# Patient Record
Sex: Male | Born: 1945 | Race: White | Hispanic: No | Marital: Married | Smoking: Never smoker
Health system: Southern US, Community
[De-identification: ages and names within clinical notes are randomized; demographics above are authoritative.]

## PROBLEM LIST (undated history)

## (undated) DIAGNOSIS — I259 Chronic ischemic heart disease, unspecified: Secondary | ICD-10-CM

## (undated) DIAGNOSIS — I2510 Atherosclerotic heart disease of native coronary artery without angina pectoris: Secondary | ICD-10-CM

## (undated) DIAGNOSIS — I10 Essential (primary) hypertension: Secondary | ICD-10-CM

## (undated) DIAGNOSIS — I493 Ventricular premature depolarization: Secondary | ICD-10-CM

## (undated) DIAGNOSIS — Z9889 Other specified postprocedural states: Secondary | ICD-10-CM

## (undated) DIAGNOSIS — E785 Hyperlipidemia, unspecified: Secondary | ICD-10-CM

## (undated) DIAGNOSIS — K219 Gastro-esophageal reflux disease without esophagitis: Secondary | ICD-10-CM

## (undated) DIAGNOSIS — Z8489 Family history of other specified conditions: Secondary | ICD-10-CM

## (undated) DIAGNOSIS — I4891 Unspecified atrial fibrillation: Secondary | ICD-10-CM

## (undated) DIAGNOSIS — E119 Type 2 diabetes mellitus without complications: Secondary | ICD-10-CM

## (undated) DIAGNOSIS — R112 Nausea with vomiting, unspecified: Secondary | ICD-10-CM

## (undated) HISTORY — DX: Hyperlipidemia, unspecified: E78.5

## (undated) HISTORY — DX: Chronic ischemic heart disease, unspecified: I25.9

## (undated) HISTORY — DX: Unspecified atrial fibrillation: I48.91

## (undated) HISTORY — PX: CARDIAC CATHETERIZATION: SHX172

## (undated) HISTORY — PX: TONSILLECTOMY: SUR1361

## (undated) HISTORY — DX: Essential (primary) hypertension: I10

## (undated) HISTORY — PX: BACK SURGERY: SHX140

## (undated) HISTORY — DX: Ventricular premature depolarization: I49.3

---
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
## 1998-04-26 ENCOUNTER — Ambulatory Visit (HOSPITAL_COMMUNITY): Admission: RE | Admit: 1998-04-26 | Discharge: 1998-04-26 | Payer: Self-pay | Admitting: Specialist

## 1998-04-26 ENCOUNTER — Encounter: Payer: Self-pay | Admitting: Specialist

## 1998-04-27 ENCOUNTER — Ambulatory Visit (HOSPITAL_COMMUNITY): Admission: RE | Admit: 1998-04-27 | Discharge: 1998-04-27 | Payer: Self-pay | Admitting: Specialist

## 1998-04-27 ENCOUNTER — Encounter: Payer: Self-pay | Admitting: Specialist

## 1999-05-21 HISTORY — PX: POSTERIOR CERVICAL FUSION/FORAMINOTOMY: SHX5038

## 1999-07-02 ENCOUNTER — Encounter: Payer: Self-pay | Admitting: Neurosurgery

## 1999-07-02 ENCOUNTER — Encounter: Admission: RE | Admit: 1999-07-02 | Discharge: 1999-07-02 | Payer: Self-pay | Admitting: Family Medicine

## 2000-05-15 ENCOUNTER — Encounter: Payer: Self-pay | Admitting: Neurosurgery

## 2000-05-15 ENCOUNTER — Encounter: Admission: RE | Admit: 2000-05-15 | Discharge: 2000-05-15 | Payer: Self-pay | Admitting: Neurosurgery

## 2000-05-20 HISTORY — PX: ANTERIOR CERVICAL DECOMP/DISCECTOMY FUSION: SHX1161

## 2000-06-04 ENCOUNTER — Encounter: Payer: Self-pay | Admitting: Neurosurgery

## 2000-06-06 ENCOUNTER — Encounter: Payer: Self-pay | Admitting: Neurosurgery

## 2000-06-06 ENCOUNTER — Inpatient Hospital Stay (HOSPITAL_COMMUNITY): Admission: RE | Admit: 2000-06-06 | Discharge: 2000-06-07 | Payer: Self-pay | Admitting: Neurosurgery

## 2000-06-24 ENCOUNTER — Encounter: Admission: RE | Admit: 2000-06-24 | Discharge: 2000-06-24 | Payer: Self-pay | Admitting: Neurosurgery

## 2000-06-24 ENCOUNTER — Encounter: Payer: Self-pay | Admitting: Neurosurgery

## 2000-07-18 ENCOUNTER — Inpatient Hospital Stay (HOSPITAL_COMMUNITY): Admission: RE | Admit: 2000-07-18 | Discharge: 2000-07-25 | Payer: Self-pay | Admitting: Cardiology

## 2000-07-18 HISTORY — PX: CORONARY ARTERY BYPASS GRAFT: SHX141

## 2000-07-19 ENCOUNTER — Encounter: Payer: Self-pay | Admitting: Cardiothoracic Surgery

## 2000-07-21 ENCOUNTER — Encounter: Payer: Self-pay | Admitting: Cardiothoracic Surgery

## 2000-07-22 ENCOUNTER — Encounter: Payer: Self-pay | Admitting: Cardiothoracic Surgery

## 2000-07-23 ENCOUNTER — Encounter: Payer: Self-pay | Admitting: Cardiothoracic Surgery

## 2001-02-04 ENCOUNTER — Encounter: Admission: RE | Admit: 2001-02-04 | Discharge: 2001-02-04 | Payer: Self-pay | Admitting: *Deleted

## 2001-02-04 ENCOUNTER — Encounter (INDEPENDENT_AMBULATORY_CARE_PROVIDER_SITE_OTHER): Payer: Self-pay | Admitting: *Deleted

## 2001-02-04 ENCOUNTER — Encounter: Payer: Self-pay | Admitting: *Deleted

## 2001-02-26 ENCOUNTER — Encounter (INDEPENDENT_AMBULATORY_CARE_PROVIDER_SITE_OTHER): Payer: Self-pay | Admitting: *Deleted

## 2001-02-26 ENCOUNTER — Ambulatory Visit (HOSPITAL_COMMUNITY): Admission: RE | Admit: 2001-02-26 | Discharge: 2001-02-26 | Payer: Self-pay | Admitting: *Deleted

## 2001-08-05 ENCOUNTER — Encounter: Payer: Self-pay | Admitting: Cardiology

## 2001-08-05 ENCOUNTER — Inpatient Hospital Stay (HOSPITAL_COMMUNITY): Admission: EM | Admit: 2001-08-05 | Discharge: 2001-08-06 | Payer: Self-pay | Admitting: *Deleted

## 2002-03-11 ENCOUNTER — Encounter: Payer: Self-pay | Admitting: Neurosurgery

## 2002-03-11 ENCOUNTER — Encounter: Admission: RE | Admit: 2002-03-11 | Discharge: 2002-03-11 | Payer: Self-pay | Admitting: Neurosurgery

## 2006-09-09 ENCOUNTER — Inpatient Hospital Stay (HOSPITAL_BASED_OUTPATIENT_CLINIC_OR_DEPARTMENT_OTHER): Admission: RE | Admit: 2006-09-09 | Discharge: 2006-09-09 | Payer: Self-pay | Admitting: Cardiology

## 2008-04-18 ENCOUNTER — Encounter: Payer: Self-pay | Admitting: Gastroenterology

## 2008-05-02 ENCOUNTER — Encounter (INDEPENDENT_AMBULATORY_CARE_PROVIDER_SITE_OTHER): Payer: Self-pay | Admitting: *Deleted

## 2008-05-02 ENCOUNTER — Encounter: Admission: RE | Admit: 2008-05-02 | Discharge: 2008-05-02 | Payer: Self-pay | Admitting: Internal Medicine

## 2008-05-09 DIAGNOSIS — K219 Gastro-esophageal reflux disease without esophagitis: Secondary | ICD-10-CM | POA: Insufficient documentation

## 2008-05-09 DIAGNOSIS — K449 Diaphragmatic hernia without obstruction or gangrene: Secondary | ICD-10-CM | POA: Insufficient documentation

## 2008-05-09 DIAGNOSIS — K8020 Calculus of gallbladder without cholecystitis without obstruction: Secondary | ICD-10-CM | POA: Insufficient documentation

## 2008-05-10 ENCOUNTER — Ambulatory Visit: Payer: Self-pay | Admitting: Gastroenterology

## 2008-05-10 DIAGNOSIS — R7309 Other abnormal glucose: Secondary | ICD-10-CM | POA: Insufficient documentation

## 2008-05-10 DIAGNOSIS — R197 Diarrhea, unspecified: Secondary | ICD-10-CM | POA: Insufficient documentation

## 2008-05-10 DIAGNOSIS — F411 Generalized anxiety disorder: Secondary | ICD-10-CM | POA: Insufficient documentation

## 2008-05-10 DIAGNOSIS — E785 Hyperlipidemia, unspecified: Secondary | ICD-10-CM | POA: Insufficient documentation

## 2008-05-10 DIAGNOSIS — Z8679 Personal history of other diseases of the circulatory system: Secondary | ICD-10-CM | POA: Insufficient documentation

## 2008-05-10 DIAGNOSIS — I2510 Atherosclerotic heart disease of native coronary artery without angina pectoris: Secondary | ICD-10-CM | POA: Insufficient documentation

## 2008-05-10 DIAGNOSIS — R1032 Left lower quadrant pain: Secondary | ICD-10-CM | POA: Insufficient documentation

## 2008-05-10 DIAGNOSIS — I10 Essential (primary) hypertension: Secondary | ICD-10-CM | POA: Insufficient documentation

## 2008-05-10 DIAGNOSIS — M129 Arthropathy, unspecified: Secondary | ICD-10-CM | POA: Insufficient documentation

## 2008-05-10 DIAGNOSIS — K5730 Diverticulosis of large intestine without perforation or abscess without bleeding: Secondary | ICD-10-CM | POA: Insufficient documentation

## 2008-06-13 ENCOUNTER — Ambulatory Visit: Payer: Self-pay | Admitting: Gastroenterology

## 2008-12-19 ENCOUNTER — Encounter: Admission: RE | Admit: 2008-12-19 | Discharge: 2008-12-19 | Payer: Self-pay | Admitting: Chiropractic Medicine

## 2009-01-14 ENCOUNTER — Inpatient Hospital Stay (HOSPITAL_COMMUNITY): Admission: EM | Admit: 2009-01-14 | Discharge: 2009-01-15 | Payer: Self-pay | Admitting: Emergency Medicine

## 2009-01-18 HISTORY — PX: LAPAROSCOPIC CHOLECYSTECTOMY: SUR755

## 2009-02-14 ENCOUNTER — Ambulatory Visit (HOSPITAL_COMMUNITY): Admission: RE | Admit: 2009-02-14 | Discharge: 2009-02-14 | Payer: Self-pay | Admitting: General Surgery

## 2009-02-14 ENCOUNTER — Encounter (INDEPENDENT_AMBULATORY_CARE_PROVIDER_SITE_OTHER): Payer: Self-pay | Admitting: General Surgery

## 2009-09-05 ENCOUNTER — Encounter: Payer: Self-pay | Admitting: Gastroenterology

## 2009-12-02 IMAGING — CT CT ANGIO CHEST
2 of 6 series · 17 of 46 positions shown · IV contrast (APPLIED)
Comparison: Ultrasound abdomen 05/02/2008

CTA CHEST

CLINICAL DATA: Chest pain.  Midsternal chest pain radiating to
back.  History of prior CABG.

CT ANGIOGRAPHY CHEST AND ABDOMEN
TECHNIQUE: Multidetector CT imaging of the chest and abdomen was
performed without contrast.  Next, using the standard protocol
during bolus administration of intravenous contrast. Multiplanar CT
image reconstructions including MIPs were obtained to evaluate the
vascular anatomy.
Contrast: 100 ml Omnipaque 350

[Series 5: dissection 2.0 st · axial · 0.81mm/px · z∈[-458,-68]mm · 14 of 226 slices shown]
[im 16/226  lung]
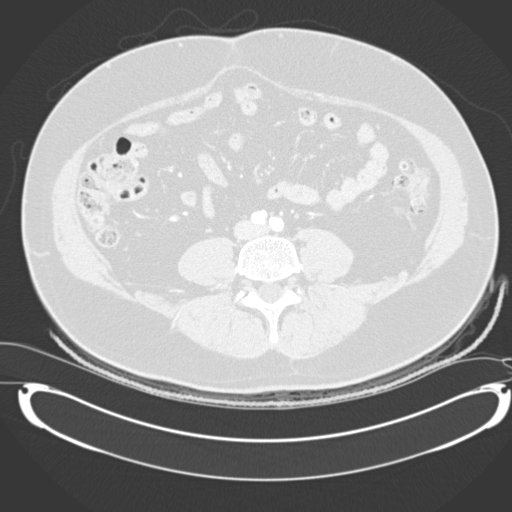
[im 31/226  soft-tissue]
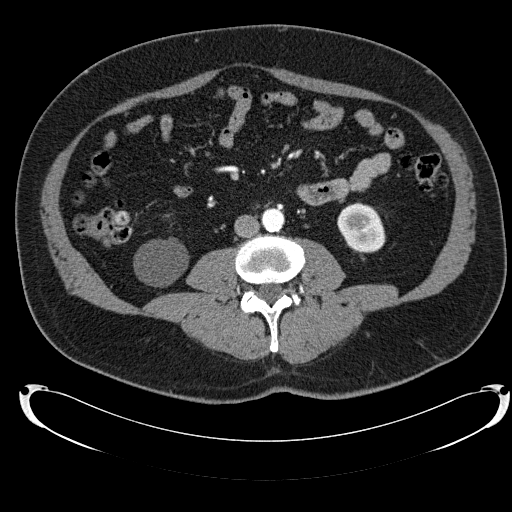
[im 46/226  lung]
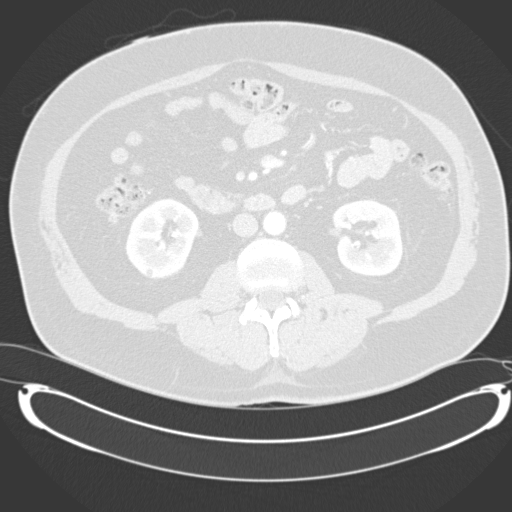
[im 61/226  soft-tissue]
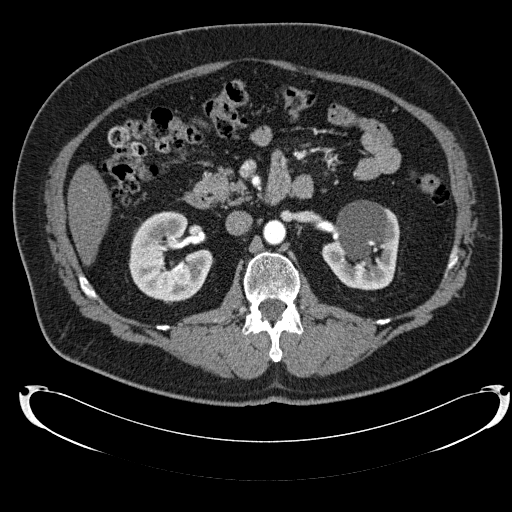
[im 76/226  lung]
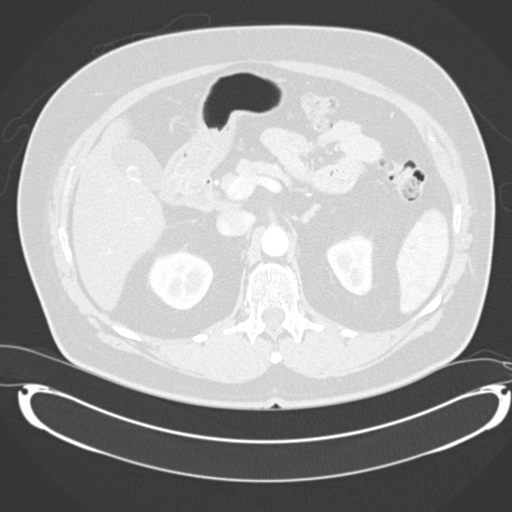
[im 91/226  soft-tissue]
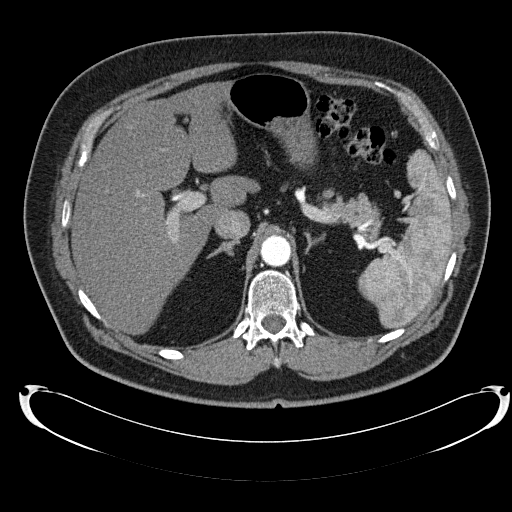
[im 106/226  lung]
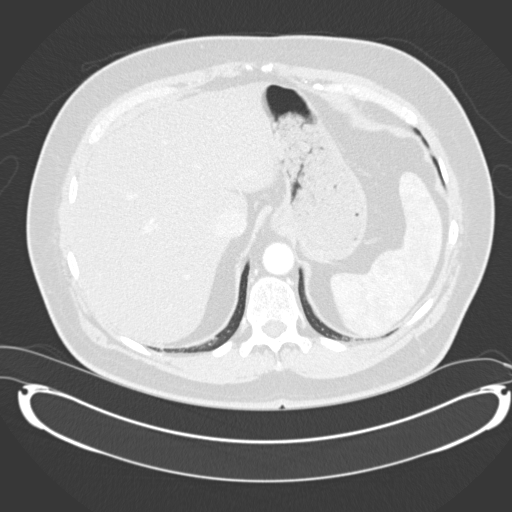
[im 121/226  soft-tissue]
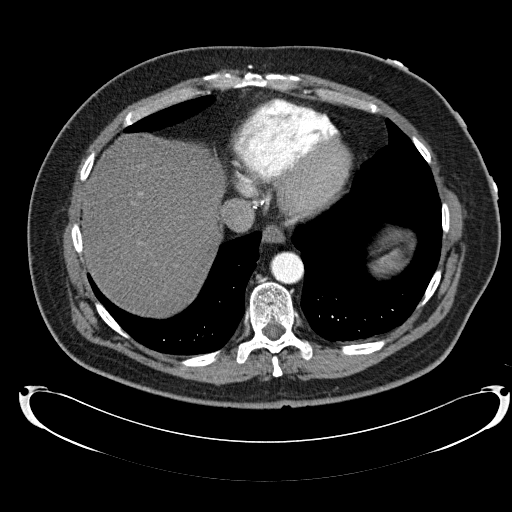
[im 136/226  lung]
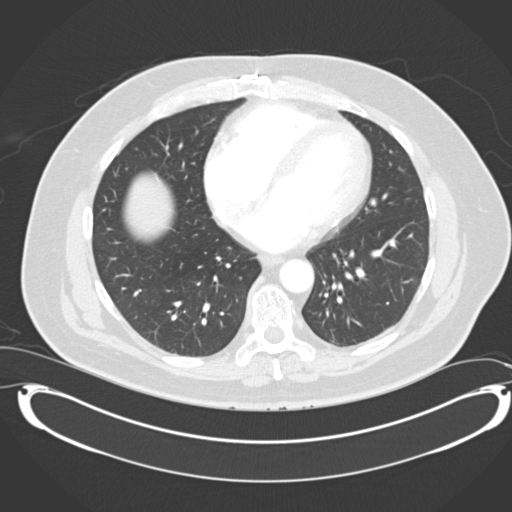
[im 151/226  soft-tissue]
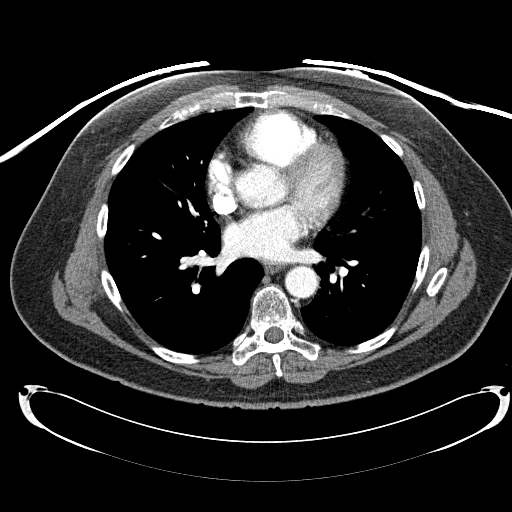
[im 166/226  lung]
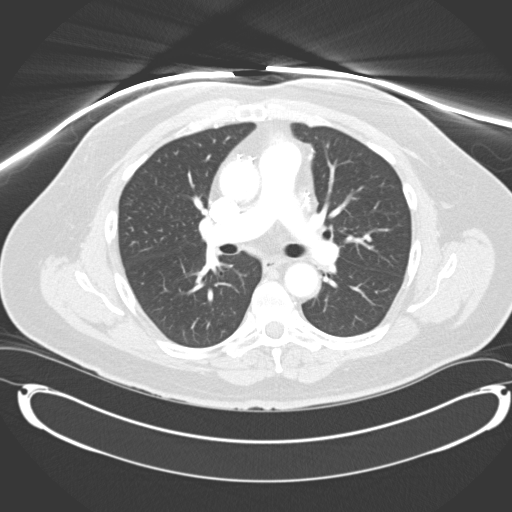
[im 181/226  soft-tissue]
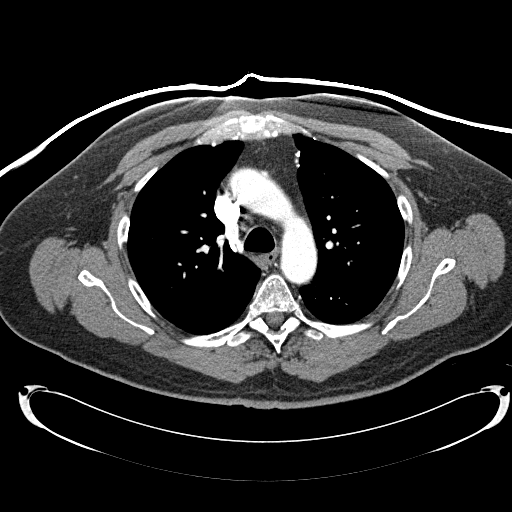
[im 196/226  lung]
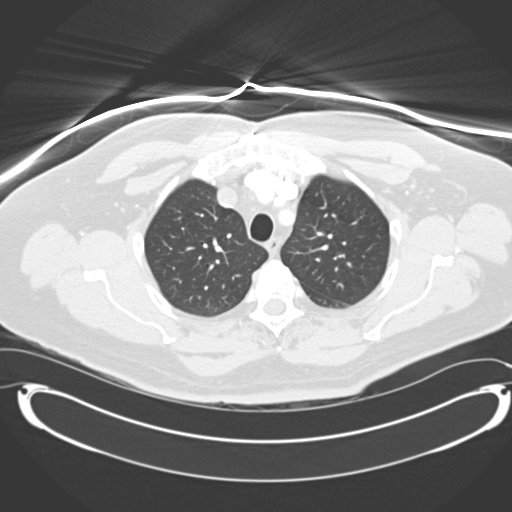
[im 211/226  soft-tissue]
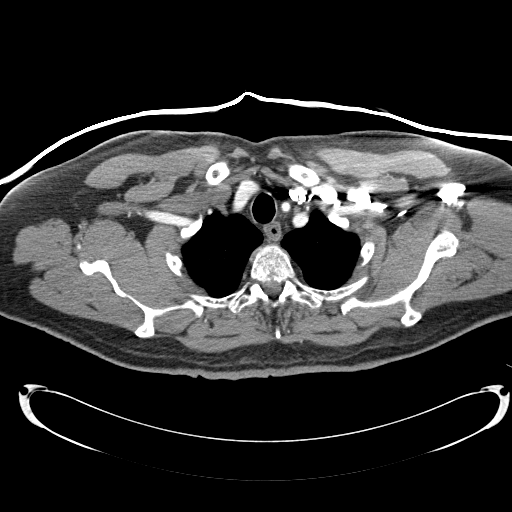

[Series 7: dissection 2.0 lung · axial · 0.81mm/px · z∈[-284,-222]mm · 3 of 139 slices shown]
[im 16/139  soft-tissue]
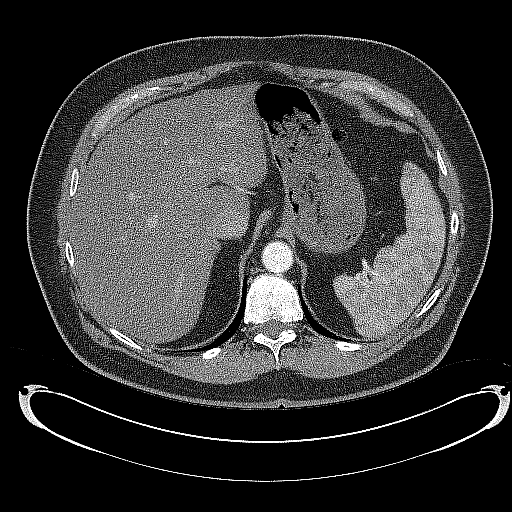
[im 31/139  soft-tissue]
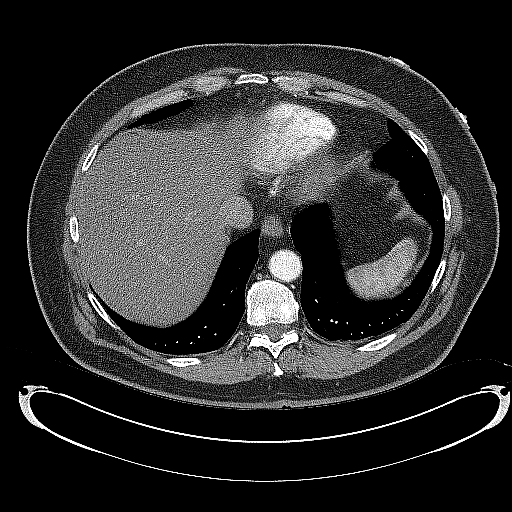
[im 47/139  soft-tissue]
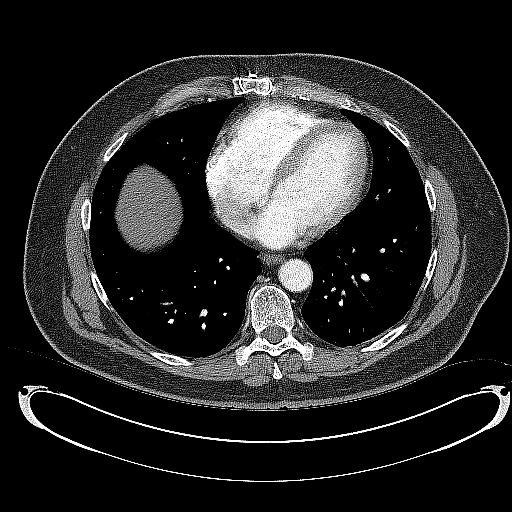

[17 of 46 positions shown; findings below may reference images not displayed]

FINDINGS: Noncontrast images of the chest show no evidence of
intramural hematoma.  There are scattered foci of calcification
within the thoracic aorta.  The thoracic aorta is normal in
caliber.

Postcontrast images of the chest shows satisfactory opacification
of the thoracic aorta.

Negative for aortic dissection.

Negative for mediastinal hematoma.

Negative for pleural or pericardial effusion or lymphadenopathy.

There are changes of median sternotomy for prior CABG.  Coronary
artery calcifications are noted.  Heart size is upper normal.

The thoracic inlet and thyroid gland are unremarkable.

There is a very small hiatal hernia.

The trachea and mainstem bronchi are patent. Aside from minimal
dependent atelectasis, the lungs are clear.

No acute or healing rib fracture is identified.

The thoracic spine vertebral bodies are normal in height and
alignment.

 Review of the MIP images confirms the above findings.
IMPRESSION: 1.  Negative for intramural hematoma, dissection, or aneurysm of
the thoracic aorta.
2.  Prior CABG.  Coronary artery and scattered thoracic aorta
atherosclerotic calcification.
3.  No acute findings in the lungs.
4.  Very small hiatal hernia.

CTA ABDOMEN
FINDINGS: Noncontrast images of the abdomen show no evidence of
acute intramural hematoma within the abdominal aorta.  The
abdominal aorta is normal in caliber.  No ectasia or aneurysm is
identified.

There are several peripherally calcified gallstones within the
gallbladder.  Largest gallstone measures approximately 11 mm.

Negative for renal calculi.  There is a 5.3 x 3.8 cm cyst in the
mid pole of the left kidney and a 1.0 cm cyst mid pole of the left
kidney.  Extending exophytically from the lower pole of the right
kidney is a 4.3 x 3.8 cm cyst.

On postcontrast images, none of the aforementioned renal cysts
enhance or show nodularity, compatible with bilateral simple renal
cysts.  Additionally visualized is a 3 mm cortically based low
density lesion posteriorly in the right kidney and a 3 mm
cortically based low density lesion in the anterior right kidney,
too small to characterize but likely cysts.

The abdominal aorta enhances normally.  Negative for dissection.
The bilateral renal arteries, celiac axis, superior mesenteric
artery, and inferior mesenteric artery are widely patent and
enhance normally. No stenoses are identified in the branches of the
aorta.

No focal liver lesion is identified on arterial phase imaging.
Normal arterial phase enhancement of the spleen is identified.

The adrenal glands and pancreas are within normal limits.

Bowel loops are normal in caliber and wall thickness.

The mesenteries are unremarkable.

No ascites or lymphadenopathy is identified.

The oral origin of the common iliac arteries bilaterally
demonstrate scattered atherosclerotic calcification without
evidence of aneurysm or dissection.

The visualized thoracolumbar spine vertebral bodies are normal in
height and alignment.

 Review of the MIP images confirms the above findings.
IMPRESSION: 1.  Negative for abdominal aortic aneurysm, ectasia, intramural
hematoma, or dissection.
2.  Scattered atherosclerotic calcification of the abdominal aorta
and visualized common iliac arteries bilaterally.
3.  Cholelithiasis.
4.  Bilateral renal cysts.

## 2010-01-02 IMAGING — RF DG CHOLANGIOGRAM OPERATIVE
1 series · 4 of 4 positions shown · non-contrast
Comparison: Abdominal CT 01/14/2009.

CLINICAL DATA: Gallstones.  Laparoscopic cholecystectomy.

INTRAOPERATIVE CHOLANGIOGRAM
TECHNIQUE: Cholangiographic images from the C-arm fluoroscopic
device were submitted for interpretation post-operatively.  Please
see the procedural report for the amount of contrast and the
fluoroscopy time utilized.

[Series 1: run · 4 of 65 frames shown]
[frame 2/65]
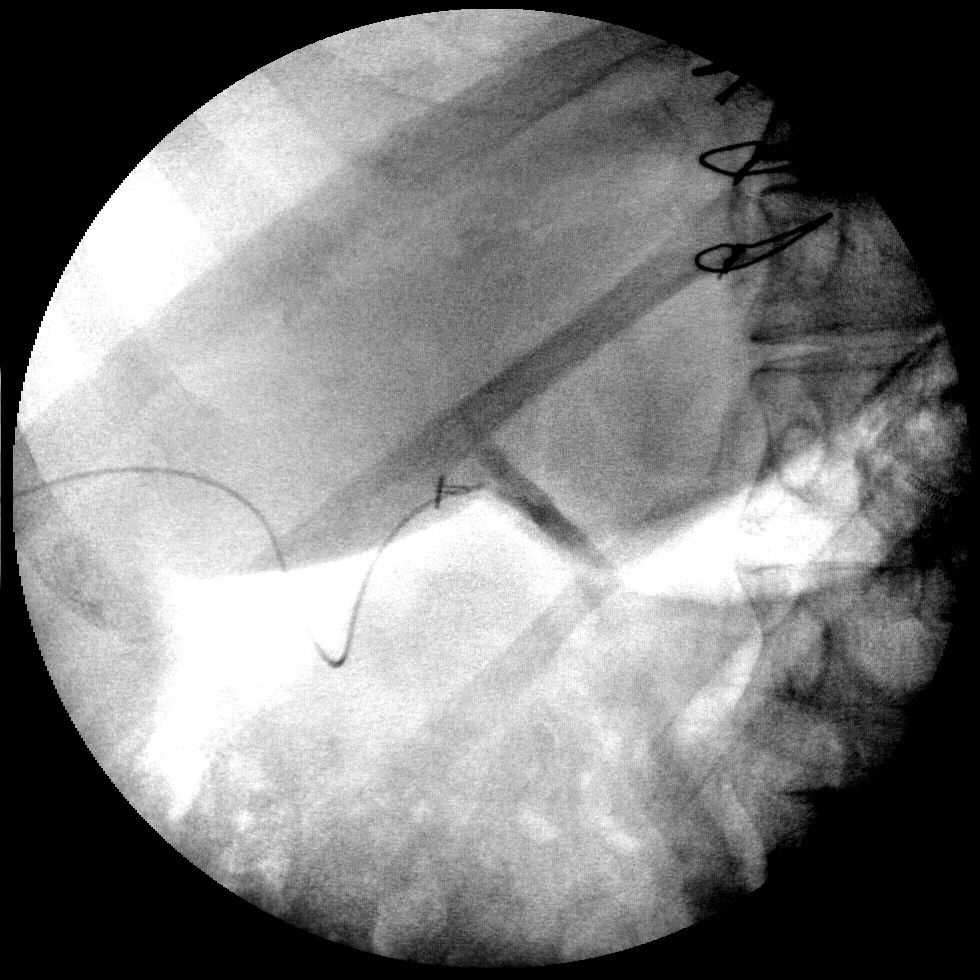
[frame 10/65]
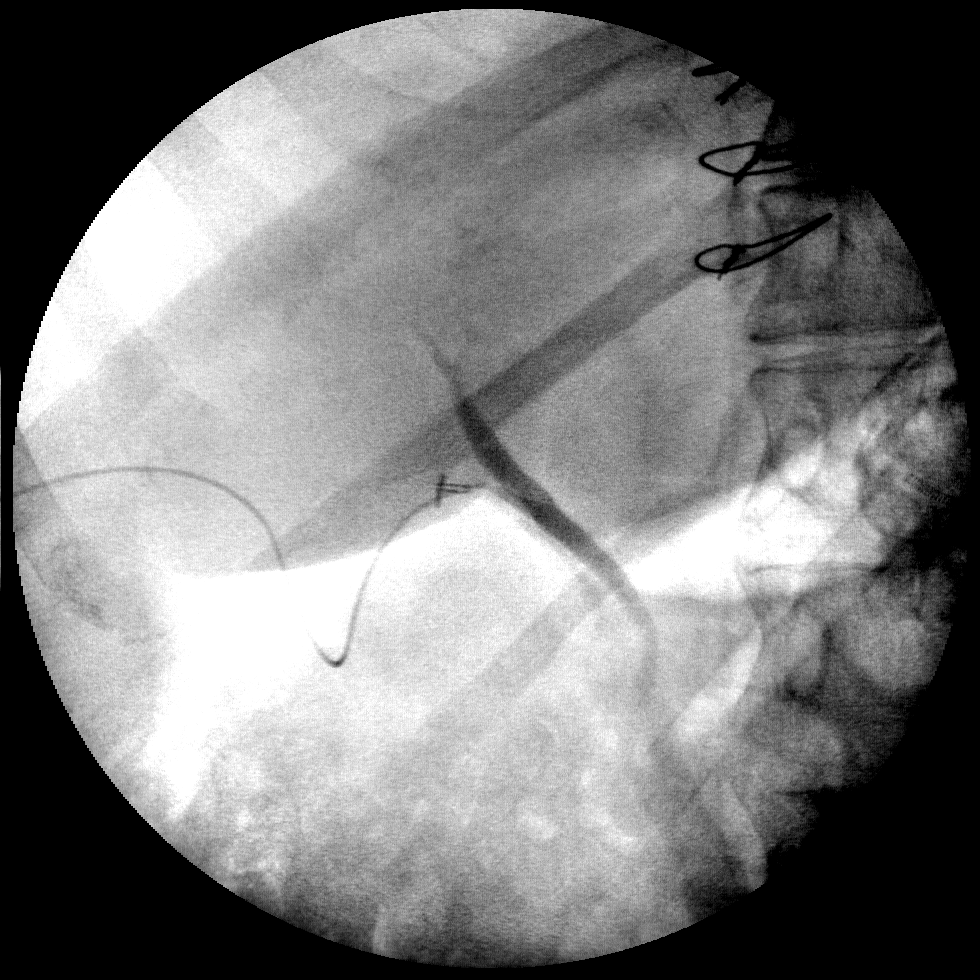
[frame 33/65]
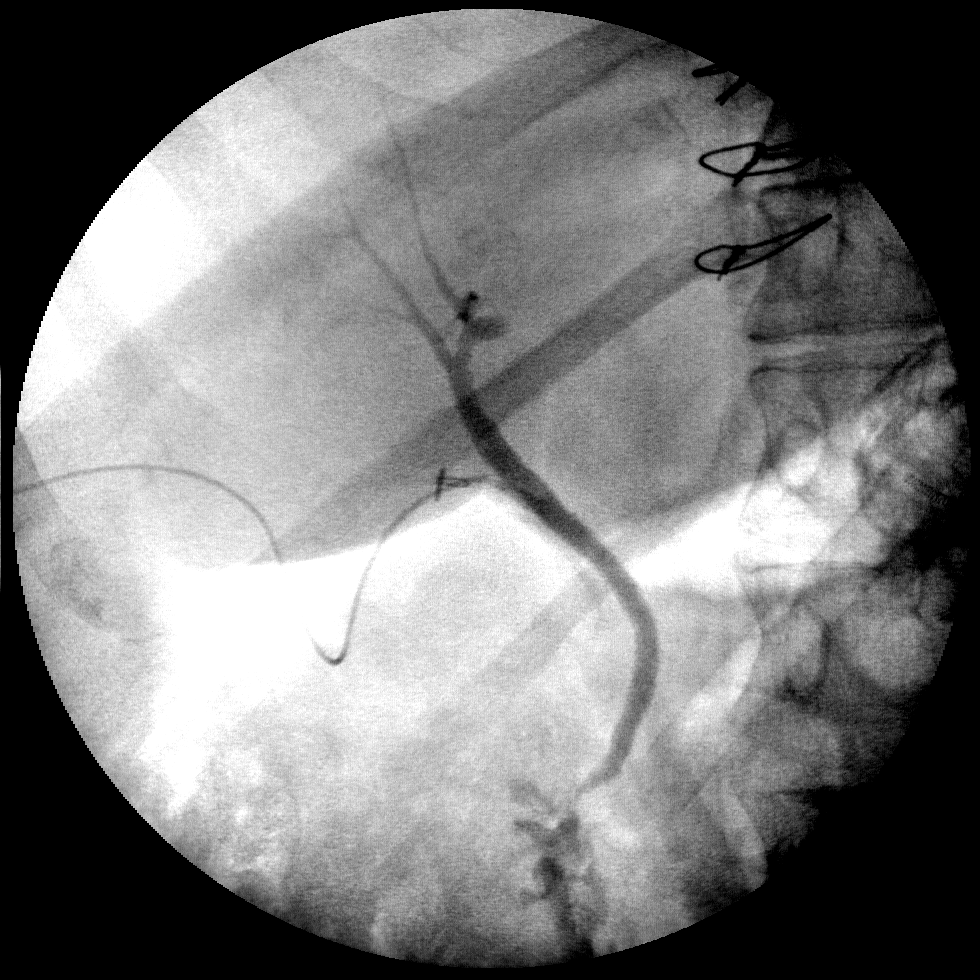
[frame 56/65]
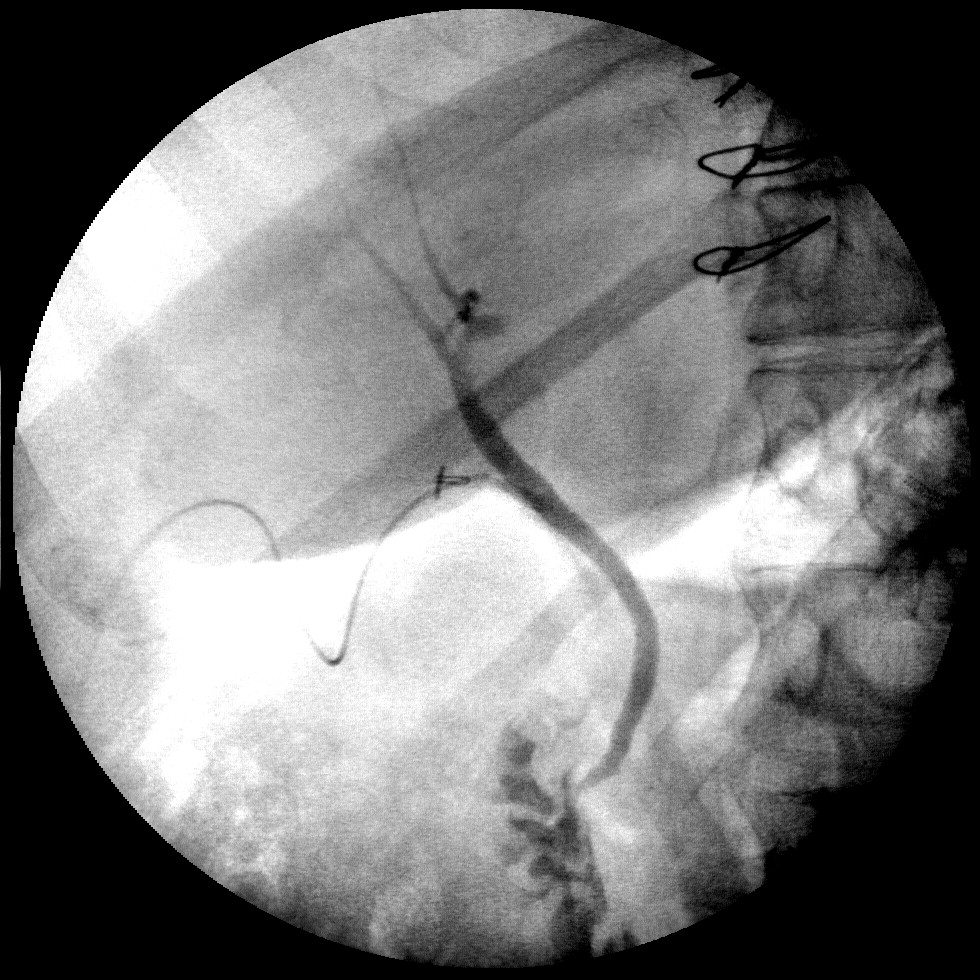

[4 of 4 positions shown; findings below may reference images not displayed]

FINDINGS: Injection of the cystic duct remnant demonstrates no
biliary dilatation or definite retained calculi.  There is no
ductal obstruction or extravasation.  The visualized intrahepatic
biliary system appears unremarkable.
IMPRESSION: Negative for retained calculus or ductal obstruction.

## 2010-04-16 ENCOUNTER — Ambulatory Visit: Payer: Self-pay | Admitting: Cardiology

## 2010-06-11 ENCOUNTER — Encounter: Payer: Self-pay | Admitting: Neurosurgery

## 2010-06-21 NOTE — Letter (Signed)
Summary: [facility] 2011 Medical Record Request  [facility] 2011 Medical Record Request   Imported By: Kassie Mends 09/06/2009 10:07:24  _____________________________________________________________________  External Attachment:    Type:   Image     Comment:   External Document

## 2010-06-22 ENCOUNTER — Ambulatory Visit (INDEPENDENT_AMBULATORY_CARE_PROVIDER_SITE_OTHER): Payer: Self-pay | Admitting: *Deleted

## 2010-06-22 ENCOUNTER — Encounter: Payer: Self-pay | Admitting: Cardiology

## 2010-06-22 DIAGNOSIS — R002 Palpitations: Secondary | ICD-10-CM

## 2010-06-22 DIAGNOSIS — I2510 Atherosclerotic heart disease of native coronary artery without angina pectoris: Secondary | ICD-10-CM

## 2010-08-05 ENCOUNTER — Emergency Department (HOSPITAL_COMMUNITY): Payer: Non-veteran care

## 2010-08-05 ENCOUNTER — Observation Stay (HOSPITAL_COMMUNITY)
Admission: EM | Admit: 2010-08-05 | Discharge: 2010-08-05 | Disposition: A | Discharge status: Home / Self Care | Payer: Non-veteran care | Attending: Internal Medicine | Admitting: Internal Medicine

## 2010-08-05 DIAGNOSIS — R002 Palpitations: Secondary | ICD-10-CM

## 2010-08-05 DIAGNOSIS — E785 Hyperlipidemia, unspecified: Secondary | ICD-10-CM | POA: Insufficient documentation

## 2010-08-05 DIAGNOSIS — R0602 Shortness of breath: Secondary | ICD-10-CM | POA: Insufficient documentation

## 2010-08-05 DIAGNOSIS — E119 Type 2 diabetes mellitus without complications: Secondary | ICD-10-CM | POA: Insufficient documentation

## 2010-08-05 DIAGNOSIS — I10 Essential (primary) hypertension: Secondary | ICD-10-CM | POA: Insufficient documentation

## 2010-08-05 DIAGNOSIS — I2510 Atherosclerotic heart disease of native coronary artery without angina pectoris: Secondary | ICD-10-CM | POA: Insufficient documentation

## 2010-08-05 DIAGNOSIS — R079 Chest pain, unspecified: Secondary | ICD-10-CM | POA: Insufficient documentation

## 2010-08-05 DIAGNOSIS — K219 Gastro-esophageal reflux disease without esophagitis: Secondary | ICD-10-CM | POA: Insufficient documentation

## 2010-08-05 LAB — LIPID PANEL
Cholesterol: 159 mg/dL (ref 0–200)
LDL Cholesterol: 112 mg/dL — ABNORMAL HIGH (ref 0–99)
Total CHOL/HDL Ratio: 5.3 RATIO

## 2010-08-05 LAB — APTT: aPTT: 30 seconds (ref 24–37)

## 2010-08-05 LAB — COMPREHENSIVE METABOLIC PANEL
AST: 31 U/L (ref 0–37)
Albumin: 4.1 g/dL (ref 3.5–5.2)
Chloride: 104 mEq/L (ref 96–112)
Creatinine, Ser: 0.88 mg/dL (ref 0.4–1.5)
GFR calc Af Amer: >60 mL/min (ref >60)
Total Bilirubin: 0.6 mg/dL (ref 0.3–1.2)
Total Protein: 7.3 g/dL (ref 6.0–8.3)

## 2010-08-05 LAB — POCT CARDIAC MARKERS: Troponin i, poc: <0.05 ng/mL (ref 0.00–0.09)

## 2010-08-05 LAB — CBC
Hemoglobin: 14.8 g/dL (ref 13.0–17.0)
MCH: 28.5 pg (ref 26.0–34.0)
MCV: 84.4 fL (ref 78.0–100.0)
RBC: 5.19 MIL/uL (ref 4.22–5.81)

## 2010-08-05 LAB — DIFFERENTIAL
Basophils Relative: 1 % (ref 0–1)
Lymphs Abs: 0.8 10*3/uL (ref 0.7–4.0)
Monocytes Relative: 8 % (ref 3–12)
Neutro Abs: 4.8 10*3/uL (ref 1.7–7.7)
Neutrophils Relative %: 76 % (ref 43–77)

## 2010-08-05 LAB — GLUCOSE, CAPILLARY: Glucose-Capillary: 192 mg/dL — ABNORMAL HIGH (ref 70–99)

## 2010-08-05 LAB — BRAIN NATRIURETIC PEPTIDE: Pro B Natriuretic peptide (BNP): 62 pg/mL (ref 0.0–100.0)

## 2010-08-05 LAB — CARDIAC PANEL(CRET KIN+CKTOT+MB+TROPI)
CK, MB: 1.3 ng/mL (ref 0.3–4.0)
Relative Index: 0.9 (ref 0.0–2.5)

## 2010-08-05 LAB — PROTIME-INR
INR: 0.95 (ref 0.00–1.49)
Prothrombin Time: 12.9 seconds (ref 11.6–15.2)

## 2010-08-10 NOTE — H&P (Signed)
NAME:  Shane Peterson, Shane Peterson NO.:  [id number]  MEDICAL RECORD NO.:  [id number]           PATIENT TYPE:  E  LOCATION:  [facility]                         FACILITY:  [facility]  PHYSICIAN:  Ramiro Harvest, MD    DATE OF BIRTH:  Mar 23, 1946  DATE OF ADMISSION:  08/05/2010 DATE OF DISCHARGE:                             HISTORY & PHYSICAL   PRIMARY CARE PHYSICIAN:  Gwen Pounds, MD.  CARDIOLOGIST:  Colleen Can. Deborah Chalk, MD  CHIEF COMPLAINTS:  Palpitations.  HISTORY OF PRESENT ILLNESS:  Shane Peterson is a pleasant 65 year old Caucasian gentleman with a history of coronary artery disease, status post CABG in 2002, history of hypertension, type 2 diabetes, hyperlipidemia, gastroesophageal reflux disease, history of palpitations, status post aortofemoral bypass presented to the ED with palpitations.  The patient stated that 1 day prior to admission while sitting in his truck, he felt his heart skipping beats and he also had some early beats lasting approximately 2 hours.  The patient stated that he had a second episode while sleeping at 11 p.m. the night prior to admission with some associated chest heaviness/tightness which was nonradiating with some associated shortness of breath, generalized weakness and near syncope.  The patient denied any nausea. No vomiting. No diaphoresis.  No dysuria.  No fever.  No chills.  No abdominal pain. No diarrhea.  No constipation.  No focal neurological symptoms.  No cough.  No other associated symptoms.  The patient stated that he saw his cardiologist in January of this year for palpitations, wore a Holter monitor for approximately a day, which was unremarkable.  The patient subsequently presented to the ED.  In the ED, EKG which was obtained was normal sinus rhythm.  D-dimer was negative.  Magnesium level was 2.1. CMET was within normal limits.  CBC was within normal limits.  Point of care cardiac markers were negative.  We are called to admit the  patient for further evaluation and management.  The patient is currently chest painfree.  ALLERGIES:  CODEINE causes hallucinations.  SULFA causes angioedema.  PAST MEDICAL HISTORY: 1. History of coronary artery disease, status post CABG in 2002. 2. Type 2 diabetes. 3. History of dyslipidemia. 4. History of cervical disk disease, status post cervical laminectomy. 5. Hypertension. 6. Status post appendectomy. 7. Status post aortofemoral bypass. 8. Status post tonsillectomy. 9. History of chronic palpitations with a prior SVT in the past. 10.Status post laparoscopic cholecystectomy, February 14, 2009 per     Dr. Zachery Dakins. 11.Gastroesophageal reflux disease. 12.Severe diverticulosis per colonoscopy of January 2010. 13.Arthritis. 14.Anxiety. 15.Hiatal hernia per EGD. 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  HOME MEDICATIONS: 1. Glipizide 2.5 mg at breakfast and 2.5 mg at lunch. 2. Fish oil 1000 mg 3 tablets in the morning and 2 tablets in the     evening. 3. Loratadine 10 mg p.o. daily p.r.n. 4. Pravachol 10 mg p.o. daily. 5. Norvasc 10 mg p.o. daily. 6. Lisinopril 5 mg p.o. daily. 7. Atenolol 25 mg p.o. b.i.d. 8. Aspirin 325 mg p.o. daily.  FAMILY  HISTORY:  Father deceased age 6 from an MI.  Mother deceased at age 32 secondary to her stroke.  Mother also had a history of AFib and hypertension.  SOCIAL HISTORY:  No tobacco use.  No alcohol use.  No IV drug use.  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  REVIEW OF SYSTEMS:  As per HPI, otherwise negative.  PHYSICAL EXAMINATION:  VITAL SIGNS:  Temperature 97.7, blood pressure 126/67, pulse of 64, respirations 12, satting 96% on room air. GENERAL:  The patient is well-developed, well-nourished gentleman, no acute cardiopulmonary distress. HEENT:  Normocephalic, atraumatic.  Pupils  equal, round and reactive to light and accommodation.  Extraocular movements intact.  Oropharynx is clear.  No lesions.  No exudates. NECK:  Supple.  No lymphadenopathy. RESPIRATORY:  Lungs are clear to auscultation bilaterally.  No wheezes. No crackles.  No rhonchi. CARDIOVASCULAR:  Regular rate and rhythm.  No murmurs, rubs or gallops. ABDOMEN:  Soft, nontender, nondistended.  Positive bowel sounds. EXTREMITIES:  No clubbing, cyanosis or edema. NEUROLOGIC:  The patient is alert and oriented x3.  Cranial nerves II- XII are grossly intact with no focal deficits.  ADMISSION LABS:  D-dimer less than 0.22, magnesium 2.1.  CMET, sodium 140, potassium 3.5, chloride 104, bicarb 28, glucose 149, BUN 16, creatinine 0.88, bilirubin of 0.6, alk phosphatase 99, AST 31, ALT 49, protein of 7.3, albumin of 4.1, calcium of 9.6.  CBC with a white count of 6.3, hemoglobin 14.8, hematocrit 43.8, platelet count of 175, ANC of 4.8.  Point of care cardiac markers CK-MB 1.6, troponin-I less than 0.05, myoglobin of 99.5.  EKG with a normal sinus rhythm.  Chest x-ray shows no evidence of active pulmonary disease.  ASSESSMENT AND PLAN:  Mr. Shane Peterson is a pleasant 65 year old gentleman with a history of coronary artery disease status post CABG in 2002, history of type 2 diabetes, hyperlipidemia, hypertension, status post aortofemoral bypass presenting to the ED with palpitations. 1. Palpitations.  Questionable etiology.  We will admit the patient to     telemetry.  We will cycle cardiac enzymes q.8 h x3.  Check a TSH.     Check coags.  Check a UA.  Check a fasting lipid panel.  CMET is     unremarkable.  Magnesium level is within normal limits.  We will     resume home dose atenolol.  We will also check a 2-D echo.  We will     consult Cardiology for further evaluation and recommendations. 2. Chest pain likely secondary to problem #1 versus acute coronary     syndrome.  The patient with a history of  coronary artery disease,     status post CABG in 2002, history of hypertension, hyperlipidemia,     type 2 diabetes.  We will cycle cardiac enzymes q.8 h x3.  D-dimer     was negative.  Magnesium level was within normal limits.  EKG was     normal sinus rhythm.  We will check a TSH.  Check coags.  Check a 2-     D echo.  We will place on oxygen, nitroglycerin as needed, morphine     sulfate as needed.  Continue home dose beta-blocker, aspirin,     Norvasc, ACE inhibitor and statin and fish oil and consult with     Cardiology for further evaluation and recommendations. 3. Hypertension, stable.  Continue home dose atenolol, lisinopril and  Norvasc. 4. Type 2 diabetes.  Check a hemoglobin A1c.  We will hold the     patient's oral hypoglycemics while in-house and we will place on     sliding-scale insulin. 5. Hyperlipidemia.  Resume home dose pravastatin and fish oil. 6. History of coronary artery disease, status post CABG.  See problem     #1 and 2.  Continue home regimen of fish, Pravachol, Norvasc,     lisinopril, atenolol and aspirin. 7. Gastroesophageal reflux disease.  We will place on a proton pump     inhibitor. 8. Prophylaxis.  Protonix for GI prophylaxis.  Lovenox for DVT     prophylaxis.  It has been a pleasure taking care of Mr. Shane Peterson.     Ramiro Harvest, MD     DT/MEDQ  D:  08/05/2010  T:  08/05/2010  Job:  [id number]  cc:   Gwen Pounds, MD Colleen Can Deborah Chalk, M.D.  Electronically Signed by Ramiro Harvest MD on 08/10/2010 12:16:37 PM

## 2010-08-10 NOTE — Discharge Summary (Signed)
NAME:  Shane Peterson, Shane Peterson NO.:  [id number]  MEDICAL RECORD NO.:  [id number]           Peterson TYPE:  O  LOCATION:  3702                         FACILITY:  [facility]  PHYSICIAN:  Ramiro Harvest, MD    DATE OF BIRTH:  [birth year]  DATE OF ADMISSION:  08/05/2010 DATE OF DISCHARGE:  08/05/2010                        DISCHARGE SUMMARY - REFERRING   PRIMARY CARE PHYSICIAN:  Gwen Pounds, MD.  CARDIOLOGIST:  Colleen Can. Deborah Chalk, M.D.  DISCHARGE DIAGNOSES: 1. Palpitations, improved. 2. Chest pain, likely secondary to problem #1. 3. Hypertension. 4. Type 2 diabetes. 5. Hyperlipidemia. 6. History of coronary artery disease, status post coronary artery     bypass graft. 7. Gastroesophageal reflux disease. 8. Hyperlipidemia.  DISCHARGE MEDICATIONS: 1. Diltiazem 120 mg CD p.o. daily. 2. Aspirin enteric-coated 325 mg p.o. daily. 3. Atenolol 25 mg p.o. b.i.d. 4. Fish oil 1000 mg 3 tablets in Shane morning, 2 tablets at night.5. Glipizide 2.5 mg in Shane morning and 2.5 mg at lunch. 6. Lisinopril 5 mg p.o. daily. 7. Loratadine 10 mg p.o. daily p.r.n. 8. Pravachol 10 mg p.o. daily.  DISPOSITION AND FOLLOWUP:  Shane Peterson will be discharged home.  [facility]/Dr. Tennant's office will call Shane Peterson so Shane Peterson can get an event recorder over Shane next month and also schedule Shane Peterson for outpatient stress test.  Shane Peterson's Norvasc was discontinued, and Shane Peterson was placed on diltiazem 120 mg daily per Cardiology's recommendations.  CONSULTATIONS DONE:  A cardiology consultation was done.  Shane Peterson was seen in consultation by Dr. Dietrich Pates of [facility] on August 05, 2010.  PROCEDURES PERFORMED:  A chest x-ray was done on August 05, 2010 that showed no evidence of active pulmonary disease.  BRIEF ADMISSION HISTORY AND PHYSICAL:  Shane Peterson is a pleasant 65 year old Caucasian gentleman with history of coronary artery disease, status post  CABG in 2002; history of hypertension; type 2 diabetes; hyperlipidemia; gastroesophageal reflux disease; history of palpitations, status post aortofemoral bypass, who presented to Shane ED with palpitations.  Shane Peterson stated that one day prior to admission, he was in his truck.  He felt his heart skipping beats.  He also had some early beats that lasted approximately 2 hours.  Shane Peterson stated he had another second episode of this while sleeping at 11:00 p.m. Shane night prior to admission with some associated chest heaviness and tightness, which was nonradiating with some associated shortness of breath, generalized weakness, and near syncope.  Shane Peterson denied any nausea.  No vomiting.  No diaphoresis.  No dysuria.  No fever.  No chills.  No abdominal pain.  No diarrhea.  No constipation.  No focal neurological symptoms.  Shane Peterson stated that he saw his cardiologist in January of this past year for palpitations, wore a Holter monitor for approximately a day, which was unremarkable.  Shane Peterson presented to Shane ED.  EKG which was obtained showing normal sinus rhythm.  D-dimer was negative.  Magnesium was 2.1.  CMET within normal limits.  CBC within normal limits.  Point of care cardiac markers were negative x1. We were called to admit  Shane Peterson for further evaluation and management.  Shane Peterson on admission was chest pain free without any palpitations.  For Shane rest of Shane admission history and physical, please see H and P dictated by Dr. Janee Morn of job [phone].  HOSPITAL COURSE:  Palpitations.  Shane Peterson was admitted with palpitations with questionable etiology.  Shane Peterson did have some associated chest pain associated with this.  EKG which was done in Shane ED had a normal sinus rhythm.  A set of cardiac enzymes were cycled, which were negative x1.  TSH was pending at Shane time of this dictation. Fasting lipid panel which was obtained did have a total cholesterol of 159,  HDL of 30, LDL of 112.  D-dimer which was obtained was less than 0.22.  Chest x-ray was negative.  Shane Peterson remained chest pain free. He did not have any palpitations during Shane hospitalization.  A Cardiology consultation was obtained.  Shane Peterson was seen in consultation by Dr. Dietrich Pates of [facility] on August 05, 2010, and at that time, it was felt that his chest pain was unlikely secondary to acute coronary syndrome or MI was probably likely to his palpitations.  He was recommended to check a set of cardiac markers and repeat EKG.  If cardiac markers were negative and repeat EKG was unremarkable, was recommended to discharge Shane Peterson, and Shane Peterson will follow up with his cardiologist as an outpatient.  It was also recommended that [facility]/Dr. Tennant's office will get in contact with Shane Peterson to set him up for outpatient stress test as well as an event recorder as an outpatient.  Shane Peterson's Norvasc was discontinued per Cardiology's recommendations, and Shane Peterson was started on diltiazem 120 mg daily.  Shane Peterson was subsequently discharged on diltiazem 120 mg daily.  Repeat EKG which was done just did show a sinus bradycardia.  Shane Peterson remained chest pain free and did not have any palpitations, and Shane Peterson will be discharged home in stable and improved condition with followup as stated above.  On Shane day of discharge, vital signs; temperature 98.0, pulse of 59, respirations 22, blood pressure 132/76, satting 96% on room air.  DISCHARGE CARDIAC LABORATORY DATA:  CK 139, CK-MB 1.3, troponin-I 0.01.  It has been a pleasure taking care of Shane Peterson.     Ramiro Harvest, MD     DT/MEDQ  D:  08/05/2010  T:  08/05/2010  Job:  [id number]  cc:   Gwen Pounds, MD Colleen Can Deborah Chalk, M.D. [facility]. Dietrich Pates, MD, [facility]  Electronically Signed by Ramiro Harvest MD on 08/10/2010 12:16:53 PM

## 2010-08-24 LAB — DIFFERENTIAL
Basophils Relative: 0 % (ref 0–1)
Eosinophils Absolute: 0 10*3/uL (ref 0.0–0.7)
Lymphs Abs: 0.7 10*3/uL (ref 0.7–4.0)
Monocytes Absolute: 0.5 10*3/uL (ref 0.1–1.0)
Monocytes Relative: 10 % (ref 3–12)

## 2010-08-24 LAB — CBC
Platelets: 180 10*3/uL (ref 150–400)
RDW: 14 % (ref 11.5–15.5)

## 2010-08-24 LAB — COMPREHENSIVE METABOLIC PANEL
ALT: 51 U/L (ref 0–53)
Albumin: 4 g/dL (ref 3.5–5.2)
Alkaline Phosphatase: 69 U/L (ref 39–117)
GFR calc Af Amer: >60 mL/min (ref >60)
Potassium: 4.2 mEq/L (ref 3.5–5.1)
Sodium: 139 mEq/L (ref 135–145)
Total Protein: 7 g/dL (ref 6.0–8.3)

## 2010-08-25 LAB — POCT I-STAT, CHEM 8
BUN: 16 mg/dL (ref 6–23)
Chloride: 104 mEq/L (ref 96–112)
HCT: 42 % (ref 39.0–52.0)
Sodium: 139 mEq/L (ref 135–145)
TCO2: 25 mmol/L (ref 0–100)

## 2010-08-25 LAB — CK TOTAL AND CKMB (NOT AT ARMC)
CK, MB: 1.6 ng/mL (ref 0.3–4.0)
Relative Index: 1.2 (ref 0.0–2.5)
Total CK: 133 U/L (ref 7–232)
Total CK: 143 U/L (ref 7–232)

## 2010-08-25 LAB — PROTIME-INR: INR: 1 (ref 0.00–1.49)

## 2010-08-25 LAB — TROPONIN I: Troponin I: 0.02 ng/mL (ref 0.00–0.06)

## 2010-08-25 LAB — COMPREHENSIVE METABOLIC PANEL
ALT: 39 U/L (ref 0–53)
AST: 30 U/L (ref 0–37)
Alkaline Phosphatase: 88 U/L (ref 39–117)
CO2: 28 mEq/L (ref 19–32)
Calcium: 9.3 mg/dL (ref 8.4–10.5)
Chloride: 103 mEq/L (ref 96–112)
GFR calc Af Amer: >60 mL/min (ref >60)
GFR calc non Af Amer: >60 mL/min (ref >60)
Glucose, Bld: 134 mg/dL — ABNORMAL HIGH (ref 70–99)
Sodium: 137 mEq/L (ref 135–145)
Total Bilirubin: 0.5 mg/dL (ref 0.3–1.2)

## 2010-08-25 LAB — POCT CARDIAC MARKERS
CKMB, poc: <1 ng/mL — ABNORMAL LOW (ref 1.0–8.0)
Myoglobin, poc: 74.3 ng/mL (ref 12–200)
Troponin i, poc: <0.05 ng/mL (ref 0.00–0.09)

## 2010-08-25 LAB — HEMOGLOBIN A1C: Hgb A1c MFr Bld: 7.9 % — ABNORMAL HIGH (ref 4.6–6.1)

## 2010-08-25 LAB — TSH: TSH: 1.458 u[IU]/mL (ref 0.350–4.500)

## 2010-08-25 LAB — GLUCOSE, CAPILLARY: Glucose-Capillary: 135 mg/dL — ABNORMAL HIGH (ref 70–99)

## 2010-09-04 ENCOUNTER — Other Ambulatory Visit: Payer: Self-pay | Admitting: Cardiology

## 2010-09-04 ENCOUNTER — Other Ambulatory Visit: Payer: Self-pay | Admitting: *Deleted

## 2010-09-04 DIAGNOSIS — R002 Palpitations: Secondary | ICD-10-CM

## 2010-09-04 MED ORDER — DILTIAZEM HCL ER COATED BEADS 120 MG PO CP24: 120.0000 mg | ORAL_CAPSULE | Freq: Every day | ORAL | Status: DC

## 2010-09-04 NOTE — Telephone Encounter (Signed)
Pt's wife requesting RN call in Diltiazem CD 120 mg to [facility] on [address] for 30 day supply and pt will pick up prescriptions for Diltiazem CD 120 mg one po daily #90 with three refills.  RN e prescribed Diltiazem CD 120 mg and left Diltizem CD 120 mg one po daily #90 with three refills up front for pt to pick up.

## 2010-09-04 NOTE — Telephone Encounter (Signed)
DILTIAZEM 120 MG 1 A DAY HE WAS PLACED ON WHEN HE WAS IN HOSPITAL ABOUT 3 WEEKS AGO BUT THEY TOOK HIM OFF OF AMLODITIME . NEEDS TO GET REFILL

## 2010-10-02 NOTE — Discharge Summary (Signed)
NAME:  Shane Peterson, Shane Peterson NO.:  [id number]   MEDICAL RECORD NO.:  [id number]          PATIENT TYPE:  INP   LOCATION:  3729                         FACILITY:  [facility]   PHYSICIAN:  Georga Hacking, M.D.DATE OF BIRTH:  12/18/1945   DATE OF ADMISSION:  01/14/2009  DATE OF DISCHARGE:  01/15/2009                               DISCHARGE SUMMARY   FINAL DIAGNOSES:  1. Prolonged chest pain of undetermined etiology - myocardial      infarction ruled out.  2. Coronary artery disease with previous bypass grafting.  3. Hyperlipidemia under treatment.  4. Diabetes mellitus, non-insulin dependent.  5. Obesity.  6. Hypertension controlled.   HISTORY OF PRESENT ILLNESS:  A 65 year old male with previous bypass  grafting 8 years ago. He had a catheterization showing his grafts to be  patent less than slightly over 2 years ago by Dr. Deborah Chalk.  He had  normally been due heavy work but noted some situational stress at his  daytime job.  He developed midsternal chest discomfort unrelieved with  nitroglycerin at home after working outdoors that radiated through to  the back.  He was brought to the emergency room by EMS and had negative  cardiac enzymes and EKG and has had no pain since then.  Please see  previously dictated history and physical for remainder of the details.   HOSPITAL COURSE:  The patient was pain free on arrival and had a normal  EKG.  Lab data showed a normal PT and PTT glucose was 134, hemoglobin  A1c is 7.9. The rest a chemistry panel was normal. Hemoglobin is 14.3,  hematocrit is 42.  Glucoses remained under reasonable control while he  was in. Serial CPK and MB's were negative.  EKG was negative and his TSH  is 1.458.  A portable chest x-ray showed no acute findings. The  emergency room physician ordered a CT scan of the chest that showed no  evidence of abdominal aneurysm, aortic dissection or pulmonary embolism.  He was ambulatory in the hall the next day and  was sent home in stable  condition. While in the hospital I gave him a couple of doses of Plavix.  He was stable and in light of the single isolated episode of pain with  normal EKG and negative enzymes, he is discharged home today and will  follow up Dr. Deborah Chalk as an outpatient to have an outpatient nuclear  stress test this week in view of the fact that the grafts were patent to  years ago.   DISCHARGE MEDICATIONS:  Are to remain the same as what he was on  previously including Crestor twice weekly, fish oil 1 gram t.i.d.,  aspirin daily 81 mg, Actos 30 mg daily, fenofibrate 160 mg daily,  atenolol 25 b.i.d., and nitroglycerin as needed.  He was given a  prescription for nitroglycerin.  He was given two doses of Plavix in the  hospital, but will not be discharged on this.   ACTIVITY:  He is to go home and rest.   FOLLOWUP:  He is to call Dr. Deborah Chalk for scheduling a  nuclear stress  test this week.      Georga Hacking, M.D.  Electronically Signed     WST/MEDQ  D:  01/15/2009  T:  01/15/2009  Job:  [id number]   cc:   Colleen Can. Deborah Chalk, M.D.

## 2010-10-02 NOTE — H&P (Signed)
NAME:  Shane Peterson, Shane Peterson NO.:  [id number]   MEDICAL RECORD NO.:  [id number]          PATIENT TYPE:  EMS   LOCATION:  MAJO                         FACILITY:  [facility]   PHYSICIAN:  Georga Hacking, M.D.DATE OF BIRTH:  [birth year]   DATE OF ADMISSION:  01/14/2009  DATE OF DISCHARGE:                              HISTORY & PHYSICAL   HISTORY:  The patient is a 65 year old male who is admitted to the  hospital to rule out myocardial infarction.  The patient has a known  history of ischemic heart disease with previous bypass grafting in March  2002 with a mammary graft to the LAD, a vein graft to the intermediate,  a sequential vein graft to first and second marginal branches, and vein  graft to posterior descending artery.  He had a catheterization in 2003  with patent grafts and again in April 2008 that showed patent grafts.  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  He is able to do heavy yard work without symptoms normally.  He had been in his usual state of health recently but has stopped  exercising.  He was out working making wood molds for concrete and came  indoors and while sitting down had pallor, mid sternal chest pain  radiating through to the back.  He took an old nitroglycerin without  relief, pain intensified.  EMS was called and he was transported here,  given additional nitroglycerin. Is currently pain free.  Initial cardiac  enzymes were negative.  An EKG was unremarkable on admission.  He is  admitted to rule out myocardial infarction.   PAST HISTORY:  \  1. Remarkable for coronary artery disease previously.  2. He has diabetes mellitus.  3. He has previous dyslipidemia and has been able to take Crestor a      couple of times a week.  4. He has a history of cervical disk disease.  5. Hypertension.   PAST SURGERIES:  1. Cervical  laminectomy.  2. Appendectomy.  3. Coronary bypass grafting.   ALLERGIES:  CODEINE and SULFA.   CURRENT MEDICATIONS:  Fish oil, fenofibrate, Crestor 2 to 3 times per  week, Actos daily, aspirin daily, atenolol b.i.d. and tramadol p.r.n.   FAMILY HISTORY:  Father died at age 3 of a heart attack.  Mother lived  up into her 76s with a history of atrial fibrillation and  hypertension.   SOCIAL HISTORY:  Lives with wife.  Does not use alcohol or tobacco to  excess.  He works as a Production designer, theatre/television/film for [employer].  Also does a Agricultural consultant business on the side.   REVIEW OF SYSTEMS:  Some weight gain recently.  He has some erythema of  the skin normally.  He has no eye, ear, nose or throat complaints  normally.  No difficulty swallowing.  He has been diagnosed with  gallstones and says he needs to have his gallbladder taken out but he is  trying to wait  until November.  He has mild nocturia and frequency.  Denies hesitation.  He has minimal arthritis.  He has no history of  stroke or TIA.  No hematochezia or bleeding.  No hemoptysis.  Other than  as noted above, the remainder of the review of systems unremarkable.   PHYSICAL EXAMINATION:  GENERAL:  He is a pleasant baldheaded male  appearing stated age in no acute distress.  VITAL SIGNS:  Blood pressure currently 140/75, pulse was 76.  SKIN:  Warm and dry.  HEENT:  EOMI.  CNS clear.  Fundi not examined.  Pharynx negative.  NECK:  Supple without masses, JVD, thyromegaly or bruits.  LUNGS:  Clear to auscultation and percussion.  CARDIAC:  Cardiac exam shows a healed median sternotomy scar, normal S1  and S2, no S3 or murmur.  ABDOMEN:  Soft, nontender.  Femoral distal  pulses 2+, previous saphenous vein harvesting scars noted.  No edema is  noted.  NEUROLOGIC:  Normal.   LABORATORY DATA:  The 12-lead EKG was normal.  Lab data admission shows  a glucose of 202.  The ISTAT was otherwise normal.  Initial point of  care enzymes were negative.   CT angiogram of the chest shows no  intramural hematoma,  scattered calcification noted.  No evidence for  aortic dissection or pericardial effusion.  Small able hernia was noted.  Chest x-ray was unremarkable.   IMPRESSION:  1. Prolonged chest discomfort, rule out myocardial infarction.  2. Coronary artery disease with previous bypass grafting with patent      vein grafts previous in March 2008.  3. Hypertension.  4. Hyperlipidemia under treatment.  5. Diabetes.  6. Cholelithiasis.  7. History of cervical disk disease.   RECOMMENDATIONS:  We will check serial enzymes and EKG and continue his  medications.  Give Lovenox for DVT prophylaxis.  Check labs.  We might  consider outpatient stress testing if he has no recurrence of pain.      Georga Hacking, M.D.  Electronically Signed     WST/MEDQ  D:  01/14/2009  T:  01/14/2009  Job:  [id number]   cc:   Colleen Can. Deborah Chalk, M.D.

## 2010-10-05 NOTE — Discharge Summary (Signed)
[place]  Patient:    Shane Peterson, Shane Peterson                      MRN: [id number] Adm. Date:  [id number] Disc. Date: [id number] Attending:  Emeterio Reeve                           Discharge Summary  BRIEF HISTORY:  For full details of this admission, please refer to typed history and physical.  The patient is a 65 year old white male who suffered from neck and arm pain and was diagnosed C3-C4 nucleus pulposus and spondylosis.  PAST MEDICAL HISTORY, PAST SURGICAL HISTORY, MEDICATIONS PRIOR TO ADMISSION, DRUG ALLERGIES, FAMILY MEDICAL HISTORY, SOCIAL HISTORY, ADMISSION PHYSICAL EXAM, IMAGING ASSESSMENT, ASSESSMENT AND PLAN, etc.:  Please refer to typed history and physical.  HOSPITAL COURSE: Dr. Channing Mutters performed a C3-C4 anterocervical diskectomy fusing and plating on the patient June 06, 2000 without complications (for full details of this operation, please refer to typed operative note).  POSTOPERATIVE COURSE:  The patients postoperative course was essentially unremarkable.  By postop day #1, he was eating well, ambulating well, his wound was healing well without signs of infection.  There was no midline shift, he had normal motor strength and he was requesting discharge to home. He was therefore discharged to home on June 07, 2000.  DISCHARGE INSTRUCTIONS:  The patient was given written discharge instructions.  FOLLOWUP:  He was told to followup with Dr. Channing Mutters in one week.  DISCHARGE MEDICATIONS: The patient was instructed to resume his outpatient medical regimen except for Vioxx and given a prescription for Tylox #60 1-2 p.o. q.4.h. p.r.n. for pain, limit 8 per day, no refills.  FINAL DIAGNOSIS:  C3-C4 spondylosis.  PROCEDURE PERFORMED:  C3-C4 anterior cervical diskectomy fusion and plating. DD:  06/07/00 TD:  06/09/00 Job: [id number]

## 2010-10-05 NOTE — Op Note (Signed)
[place]  Patient:    Shane Peterson, Shane Peterson                      MRN: [id number] Proc. Date: 07/21/00 Adm. Date:  [id number] Attending:  Mikey Bussing CC:         Aram Candela. Aleen Campi, M.D.  Erskine Speed, M.D.   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  PROCEDURE:  Coronary artery bypass grafting x 5 (left internal mammary artery to the left anterior descending coronary artery, saphenous vein graft to ramus intermediate, sequential saphenous vein graft to first obtuse marginal and second obtuse marginal, saphenous vein graft to distal posterior descending).  SURGEON:  Mikey Bussing, M.D.  ASSISTANTS:  Sherrie George, P.A., and Dominica Severin, P.A.  ANESTHESIA:  General by Janetta Hora. Gelene Mink, M.D.  INDICATIONS:  Patient is a 65 year old white male with a positive family history for coronary disease, who presents with progressive angina.  Cardiac catheterization demonstrated severe left main and two-vessel coronary disease with subtotal disease of the distal posterior descending.  He was referred for surgical coronary revascularization.  Prior to the operation, I examined the patient in his hospital room and reviewed the results of the cardiac catheterization with the patient and his wife.  I discussed the indications and expected benefits of coronary bypass surgery.  I reviewed the alternatives to surgical coronary revascularization with the patient.  I discussed the major aspects of the coronary bypass operation with the patient, including the location of the surgical incisions, the choice of conduit, the use of general anesthesia and cardiopulmonary bypass, and the expected possible recovery period.  I reviewed the risks that are  associated with this operation with the patient, including the risks of MI, CVA, bleeding, infection, and death.  The patient understood these aspects of the operation and agreed to proceed with the operation as planned under informed consent.  OPERATIVE FINDINGS:  The saphenous vein was of average to above-average quality.  The mammary artery was a good vessel with excellent flow.  The LAD and circumflex systems were severely stenotic.  The distal posterior descending had a significant plaque, which was easily palpable, and a 50% stenosis by application of the intracoronary probe.  Overall ventricular function was well-preserved.  DESCRIPTION OF PROCEDURE:  The patient was brought to the operating room and placed supine on the operating room table, where general anesthesia was induced under invasive hemodynamic monitoring.  Preoperative assessment of the left arm indicated the palmar arch was not completely intact, and the radial artery was not harvested.  The patient was prepped and draped as a sterile field, and a sternotomy incision was made as the saphenous vein was harvested from the right leg.  The internal mammary artery was harvested as a pedicle graft from its origin at the subclavian vessels.  The sternal retractor was placed.  Heparin was administered, and the ACT was documented as being therapeutic.  The patient was cannulated through pursestrings placed in the ascending aorta and right atrium.  He was placed on bypass.  The coronaries were identified.  The mammary artery and vein grafts were prepared for the distal anastomoses.  A cardioplegia cannula was placed, and as the aortic crossclamp was applied, 500 cc of cold blood cardioplegia was delivered to  the aortic root with immediate cardioplegic arrest and septal temperature dropping to less than 12 degrees.  Topical ice saline slush was used to augment myocardial preservation, and the pericardial insulator pad was used to  protect the left phrenic nerve.  The distal coronary anastomoses were then performed.  The first distal anastomosis was to the distal posterior descending.  This was a 1.5 mm vessel with proximal 50-60% stenosis.  A reversed saphenous vein was sewn end-to-side with running 7-0 Prolene with good flow through the graft.  The second distal anastomosis was to the ramus intermediate.  This was a smaller, 1.2 mm vessel with proximal 80% stenosis.  A reversed saphenous vein was sewn end-to-side with running 7-0 Prolene, and there was good flow through the graft.  The third and fourth distal anastomoses consisted of a sequential vein graft to the OM1 and OM2.  The OM1 was a 1.5 mm vessel with proximal 90% stenosis, and a side branch of the vein was sewn side-to-side with running 7-0 Prolene with good flow through the graft.  The fourth distal anastomosis was the continuation of the sequential vein graft to the OM2.  This was a larger 1.8 mm vessel with proximal 90% stenosis.  The end of the vein was sewn end-to-side with a running 7-0 Prolene, and there was good flow through the graft.  Cardioplegia was redosed.  The fifth distal anastomosis the distal third of the LAD, which was a diffusely diseased vessel 1.5 mm in diameter. The left internal mammary artery pedicle was brought through an opening created in the left parietal pericardium and was brought down on the LAD and sewn end-to-side with a running 8-0 Prolene.  There was excellent flow through the anastomosis with immediate rise in septal temperature after release of the pedicle clamp on the mammary artery.  The mammary pedicle was secured to the epicardium, and the aortic crossclamp was removed.  The heart resumed a spontaneous rhythm.  Using a partial occluding clamp, three proximal vein anastomoses were placed on the ascending aorta with a 4.0 mm punch and running 6-0 Prolene.  The partial clamp was removed, and the vein grafts were  perfused.  Each had excellent flow, and hemostasis was documented  at the proximal and distal anastomoses.  The patient was rewarmed and reperfused.  Temporary pacing wires were applied.  When the mammary artery distal anastomosis was checked after the heart was lifted to check the circumflex grafts, it was noted there was some bleeding from the heel.  It was elected to redo the LAD-mammary anastomosis, which was performed under a separate short crossclamp with again a second application of cardioplegic arrest.  The anastomosis was redone with a running 8-0 Prolene, and there was again excellent flow and hemostasis was documented.  The patient was rewarmed and reperfused, and all grafts were hemostatic.  The lungs were re-expanded, and the ventilator was resumed when the patient reached 37 degrees.  The patient was then weaned from cardiopulmonary bypass without inotropes with stable blood pressure and cardiac output.  Protamine was administered, and the cannulas were removed.  The mediastinum was irrigated with warm antibiotic irrigation.  The leg incision was irrigated and closed in a standard fashion. Pericardium was loosely reapproximated.  Two mediastinal and a left pleural chest tube were placed and brought out through separate incisions.  The sternum was reapproximated with interrupted steel wire.  Pectoralis fascia and subcutaneous layers were closed with running Vicryl, and the skin was closed with a subcuticular.  Sterile dressings were applied.  Total cardiopulmonary bypass time was 180 minutes with aortic crossclamp time of 80 minutes. DD:  07/21/00 TD:  07/22/00 Job: [id number]

## 2010-10-05 NOTE — Procedures (Signed)
[facility]  Patient:    Shane Peterson, Shane Peterson Visit Number: [id number] MRN: [id number]          Service Type: END Location: ENDO Attending Physician:  Sabino Gasser Proc. Date: 02/26/01 Admit Date:  02/26/2001   CC:         Lenon Curt. Cassell Clement, M.D.   Procedure Report  PROCEDURE:  Upper endoscopy.  INDICATIONS:  Gastroesophageal reflux disease symptoms, significantly improved since we switched him to Protonix.  ANESTHESIA:  Demerol 100 mg, Versed 8 mg.  DESCRIPTION OF PROCEDURE:  With the patient mildly sedated in the left lateral decubitus position, the Olympus videoscopic endoscope was inserted into the mouth and passed under direct vision through the esophagus, which appeared normal.  There was no evidence of Barretts seen.  We biopsied the distal esophagus to verify that.  Subsequently, we entered into the stomach.  The fundus, body, antrum, duodenal bulb and second portion of the duodenum were all well-visualized.  From this point, the endoscope was slowly withdrawn, taking circumferential views of the entire duodenal mucosa until the endoscope then pulled back into the stomach and placed in retroflexion to view the stomach from below, and a hiatal hernia was seen, as evidenced by poor closure of the GE junction around the endoscope.  The endoscope was then straightened, withdrawn; a biopsy for Helicobacter pylori was taken.  The patients vital signs and pulse oximeter remained stable.  The patient tolerated the procedure well without apparent complications.  FINDINGS:  Hiatal hernia.  PLAN:  Await biopsy report.  The patient will call me for the results and follow up with me as an outpatient. Attending Physician:  Sabino Gasser DD:  02/26/01 TD:  02/26/01 Job: [id number]

## 2010-10-05 NOTE — Cardiovascular Report (Signed)
NAME:  Shane Peterson, Shane Peterson NO.:  [id number]   MEDICAL RECORD NO.:  [id number]          PATIENT TYPE:  OIB   LOCATION:  1999                         FACILITY:  [facility]   PHYSICIAN:  Colleen Can. Deborah Chalk, M.D.DATE OF BIRTH:  [birth year]   DATE OF PROCEDURE:  09/09/2006  DATE OF DISCHARGE:                            CARDIAC CATHETERIZATION   PROCEDURE:  Left heart catheterization with selective coronary  angiography, left ventricular angiography, saphenous vein graft  angiography x3, and angiography of left internal mammary artery.   TYPE AND SITE OF ENTRY:  Percutaneous right femoral artery.   CATHETERS:  A 4-French, four curved, Judkins right and left coronary  catheters; 4-French bypass graft catheter; a 4-French no torque right  catheter; a left internal mammary graft catheter.   CONTRAST:  Pure Omnipaque.   MEDICATIONS GIVEN PRIOR PROCEDURE:  Valium 10 mg.   MEDICATIONS GIVEN DURING THE PROCEDURE:  Versed 2 mg IV.   COMMENTS:  The patient tolerated the procedure well.   HEMODYNAMIC DATA:  The aortic pressure was 134/72, LV pressure was 138/4-  9.  There is no aortic valve gradient noted on pullback.   ANGIOGRAPHIC DATA:  The left ventricular angiogram performed in RAO  position.  Overall global ejection fraction would be estimated to be at  55%.  While there was no discrete regional wall motion abnormality,  there was borderline global decreased left ventricular function.  There  was no mitral regurgitation, intracardiac calcification, or  intracavitary filling defect.   ANGIOGRAPHIC DATA:  1. Left internal mammary graft to the LAD was satisfactory with nice      insertion with a good distal runoff.  2. Saphenous vein graft to the diagonal was a satisfactory saphenous      vein graft to the smooth body of the graft; and normal insertion      and a good distal runoff.  3. Saphenous vein graft sequentially to obtuse marginal one and obtuse      marginal two  had a smooth body of the graft with satisfactory      insertions and good distal runoff.  4. Saphenous vein graft to the posterior descending had satisfactory      body of the graft with satisfactory insertion and good distal      runoff.  5. The right coronary artery was a large dominant vessel.  It had      diffuse irregularities.  The posterior descending was supplied by      flow from the saphenous vein graft.  There is no obstructive      disease.  6. The main coronary artery had a 60% distal narrowing.  7. The left anterior descending had a 70% ostial stenosis.  It then      had diffuse 60-70% narrowing with a 90% narrowing at the      midportion.  Collaterals were obtained from the left internal      mammary graft distally.  8. The left circumflex had a 60% ostial narrowing.  Obtuse marginal      one was occluded but supplied by the saphenous vein graft.  The      obtuse marginal two did have some competitive flow distally.  It      had a 90% proximal stenosis.  The revascularization appeared to be      satisfactory by way of the saphenous vein graft to the circumflex      system.   OVERALL IMPRESSION:  1. Low normal global ejection fraction with no regional wall motion      abnormality.  2. Severe 3-vessel disease with satisfactory revascularization; with      satisfactory saphenous vein graft to the posterior descending,      satisfactory saphenous vein graft to obtuse marginal one and obtuse      marginal two, and satisfactory saphenous vein graft to the      diagonal, and a satisfactory left internal mammary graft to the      LAD.   DISCUSSION:  It is felt that Shane Peterson is revascularized at this point.  We will need to focus on better lipid management, as well as, diabetes  control and weight reduction.      Colleen Can. Deborah Chalk, M.D.  Electronically Signed     SNT/MEDQ  D:  09/09/2006  T:  09/09/2006  Job:  [id number]

## 2010-10-05 NOTE — Discharge Summary (Signed)
[place]. [facility]  Patient:    JAMONTAE, THWAITES Visit Number: [id number] MRN: [id number]          Service Type: MED Location: [facility] 2899 01 Attending Physician:  Eleanora Neighbor Dictated by:   Jennet Maduro Earl Gala, R.N., A.N.P. Admit Date:  08/05/2001 Discharge Date: 08/06/2001   CC:         Lenon Curt. Cassell Clement, M.D.   Discharge Summary  PRIMARY DISCHARGE DIAGNOSIS:  Prolonged episode of chest pain with subsequent admission, negative cardiac enzymes, and stable cardiac catheterization findings.  SECONDARY DISCHARGE DIAGNOSES: 1. Previous coronary artery bypass grafting March 2002 with left internal    mammary artery to the left anterior descending, saphenous vein graft to the    ramus, saphenous vein graft to obtuse marginal I and II, and saphenous vein    graft to posterior descending. 2. Diet controlled diabetes. 3. Gastroesophageal reflux disease. 4. Hyperlipidemia. 5. History of palpitations and previous supraventricular tachycardia.  HISTORY OF PRESENT ILLNESS:  Mr. Karczewski is a very pleasant 65 year old white male who walked into our office on August 04, 2001, after having a prolonged episode of chest discomfort. This was basically new for him.  One year ago, he did undergo coronary artery bypass grafting after having a positive treadmill test.  He has basically done well since that time.  He was subsequently seen in the office and was given nitroglycerin with a fair amount of relief and was referred on to the emergency room for further evaluation and admission.  Please see the history and physical for further patient presentation and profile.  LABORATORY DATA ON ADMISSION:  Cardiac enzymes were negative.  Chemistries were satisfactory except for glucose of 150.  CBC was within normal limits, except for platelet count was 140.  PT and PTT were unremarkable.  A 12-lead electrocardiogram was normal.  Chest x-ray report is currently  pending at the time of dictation.  HOSPITAL COURSE:  The patient was admitted to the coronary care unit. He was placed on IV nitroglycerin and heparin.  He had no reoccurrence of chest pain. We preceded on with cardiac catheterization the following morning.  Left ventricular function is normal.  Left main had a 70% narrowing.  The LAD had an 80% narrowing. The left circumflex had 30 to 40% narrowings with bidirectional flow to the obtuse marginal system. The right coronary artery had a 70% narrowing in the posterior descending.  All saphenous vein grafts to the PD, OM I and II, diagonal and left internal mammary to the LAD are patent. At this point in time it is felt that Mr. Foss is a low risk from a cardiovascular standpoint and will be discharged later on today with further evaluation as an outpatient to occur.  CONDITION ON DISCHARGE:  Satisfactory.  DISCHARGE MEDICATIONS:  He will resume all of his previous home medications. We will be adding Imdur 30 mg a day.  He is instructed to not use Viagra.  ACTIVITY:  This is to be light over the next few days.  He is to call our office for any problems. Otherwise, will plan on seeing him in approximately seven to 10 days and he is asked to call to set that appointment up with a nurse practitioner. Dictated by:   Jennet Maduro Earl Gala, R.N., A.N.P. Attending Physician:  Eleanora Neighbor DD:  08/06/01 TD:  08/07/01 Job: [id number]

## 2010-10-05 NOTE — H&P (Signed)
NAME:  Shane Peterson, Shane Peterson NO.:  [id number]   MEDICAL RECORD NO.:  [id number]           PATIENT TYPE:   LOCATION:                                 FACILITY:   PHYSICIAN:  Colleen Can. Deborah Chalk, M.D.    DATE OF BIRTH:   DATE OF ADMISSION:  09/09/2006  DATE OF DISCHARGE:                              HISTORY & PHYSICAL   CHIEF COMPLAINT:  Chest pain.   HISTORY OF PRESENT ILLNESS:  Shane Peterson is a 65 year old white male who  has a known history of ischemic heart disease. of breath at rest.  He  had previous coronary artery bypass grafting in 2002.  He presents for  elective cardiac catheterization.  He was seen as a work-in appointment  on September 08, 2006, after a 2-year absence.  He complains of chest pain  that started last evening.  It involved the whole chest, as well as the  left side of the spine.  He took nitroglycerin that was old, with no  significant relief.  He has had some shortness of breath.  He was  currently pain free.  His episode lasted about 1-1/2 hours.  He has had  some loose stools and had a feeling of being sick on the stomach and  feels drained.  He has been working probably to excess.  Blood pressure  has been uncontrolled.  He is not able to take Statins for his lipids.  He now presents for elective cardiac catheterization.   PAST MEDICAL HISTORY:  1. Known atherosclerotic cardiovascular disease.  He had previous      coronary artery bypass grafting x5 in March 2002, with left      internal mammary to the LAD, saphenous vein graft to the ramus      intermediate, sequential saphenous vein graft to the first OM and      second OM and saphenous vein graft to the posterior descending      distally.  His last catheterization occurred in 2003, due to      complaints of chest discomfort.  At that time, he had normal LV      function.  He had severe three-vessel disease including left main      stenosis.  His grafts were patent.  2. Chronic  palpitations.  3. His diabetes.  4. Dyslipidemia with chronically low HDL.  He is not able to take      Statins.  5. Cervical spine disease.  6. History of appendectomy.  7. Hypertension, uncontrolled.   ALLERGIES:  CODEINE AND SULFA.   CURRENT MEDICATIONS:  1. Norvasc 5 mg a day.  2. Fish oil daily.  3. Tricor daily.  4. Actos daily.  5. Aspirin daily.  6. Tramadol p.r.n.  7. Atenolol 25 b.i.d.   FAMILY HISTORY:  Father died at 40 with a heart attack.  Mother lived up  into her 65s, with a history of atrial fib and hypertension.   SOCIAL HISTORY:  He lives at home with his wife.  He has no current  alcohol or tobacco.   REVIEW OF SYSTEMS:  As  noted above and is otherwise unremarkable.   EXAM:  GENERAL:  He is a pleasant white male.  He is currently pain free  in no acute distress.  VITAL SIGNS:  Blood pressure is 156/96, heart rate is 58.  His weight  209 pounds.  SKIN:  Warm and dry.  Color is extremely flushed.  LUNGS:  Clear.  HEART:  Shows a regular rhythm.  ABDOMEN:  Soft.  EXTREMITIES:  Without edema.  NEUROLOGIC:  Shows no gross focal deficits.   PERTINENT LABS:  EKG shows sinus bradycardia.  There are no acute  changes.   His other labs are pending.   OVERALL IMPRESSION:  1. Chest pain.  2. Known ischemic heart disease with previous coronary artery bypass      grafting x5.  3. Hyperlipidemia.  4. Hypertension.  5. Diabetes.   PLAN:  Will proceed on with diagnostic catheterization.  The procedure,  risks and benefits have all been explained to Shane Peterson, and he is  willing to proceed on Tuesday, September 09, 2006.      Sharlee Blew, N.P.      Colleen Can. Deborah Chalk, M.D.  Electronically Signed    LC/MEDQ  D:  09/08/2006  T:  09/08/2006  Job:  [id number]   cc:   Gwen Pounds, MD  Colleen Can Deborah Chalk, M.D.

## 2010-10-05 NOTE — H&P (Signed)
[place]. [facility]  Patient:    Shane Peterson, Shane Peterson Visit Number: [id number] MRN: [id number]          Service Type: MED Location: [facility] 2899 01 Attending Physician:  Eleanora Neighbor Dictated by:   Jennet Maduro Earl Gala, R.N., A.N.P. Admit Date:  08/05/2001 Discharge Date: 08/06/2001   CC:         Lenon Curt. Cassell Clement, M.D.   History and Physical  DATE OF BIRTH:  1945/09/03.  CHIEF COMPLAINT:  Chest pain.  HISTORY OF PRESENT ILLNESS:  Mr. Pandya is a very pleasant 65 year old white male who has known atherosclerotic cardiovascular disease.  He has had previous coronary artery bypass grafting 1 year ago.  He has had residual palpitations since that time.  He presents to our office as a walk-in earlier this afternoon with chest discomfort.  It has basically been occurring all day.  It is described as squeezing type discomfort which seemingly comes and goes.  He had some mild shortness of breath and association and has felt nauseated but he has had no actual vomiting.  He has really had no precipitating event.  He has not used any nitroglycerin.  He did take an aspirin at 2:15 p.m.  Here in our office he was given nitroglycerin spray x 1 with relief.  His EKG was normal.  In light of the ongoing symptoms and duration he is referred on to the emergency room per EMS for evaluation and admission.  PAST MEDICAL HISTORY: 1. Atherosclerotic cardiovascular disease with coronary artery bypass grafting    x 5 by Dr. Kathlee Nations Trigt III, in March 2002.  Records regarding this are    currently unavailable. 2. Palpitations. 3. History of diabetes controlled by diet. 4. Dyslipidemia with chronically low HDL. 5. Cervical disk disease with surgical repair in January 2002. 6. History of appendectomy.  ALLERGIES:  CODEINE and SULFA.  CURRENT MEDICATIONS: 1. Zocor 10 mg a day. 2. Atenolol 25 mg b.i.d. 3. Protonix 40 mg a day. 4. Vitamin daily. 5. Aspirin  daily. 6. Inderal p.r.n.  FAMILY HISTORY:  Father died at age 52 due to heart attack.  Mother is living at the age of 54 with a history of atrial fibrillation and hypertension.  SOCIAL HISTORY:  He lives at home with his wife and 2 children.  There is no alcohol or tobacco products.  REVIEW OF SYSTEMS:  Basically as noted above and is otherwise unremarkable.  PHYSICAL EXAMINATION:  GENERAL:  He is currently in no acute distress but is continuing to complain of chest discomfort.  VITAL SIGNS:  Blood pressure is 150/80 sitting, 130/80 standing.  Heart rate 64.  Weight is 198 pounds.  SKIN:  Warm and dry.  Color is somewhat flushed.  LUNGS:  Clear.  HEART:  Regular rhythm.  ABDOMEN:  Soft, positive bowel sounds.  Nontender.  EXTREMITIES:  Without edema.  NEUROLOGIC:  Intact.  LABORATORY DATA:  Currently pending.  A 12-lead electrocardiogram here in the office is normal.  OVERALL IMPRESSION: 1. Prolonged episode of chest pain. 2. Known coronary disease with previous bypass surgery 1 year ago. 3. Palpitations, chronic. 4. Hypercholesterolemia on Zocor.  PLAN:  Will proceed on with admission to the hospital. Will start him on IV nitroglycerin and heparin.  Will check complete lab work and will plan for him to undergo coronary angiography in the morning.  Will try to obtain old records as well. Dictated by:   Jennet Maduro Earl Gala, R.N., A.N.P.  Attending Physician:  Eleanora Neighbor DD:  08/05/01 TD:  08/05/01 Job: [id number]

## 2010-10-05 NOTE — Op Note (Signed)
[place]. [facility]  Patient:    Shane Peterson, Shane Peterson                      MRN: [id number] Proc. Date: 06/06/00 Adm. Date:  [id number] Disc. 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  OPERATION:  C3-C4 anterior cervical diskectomy and fusion with the Tether plate.  SURGEON:  Payton Doughty, M.D.  ANESTHESIA:  General endotracheal.  PREPARATION:  Prepped with sterile Betadine prep and scrubbed with alcohol wipe.  COMPLICATIONS:  None.  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  DESCRIPTION OF PROCEDURE:  He was taken to the operating room, smoothly anesthetized and intubated and placed supine on the operating table in the Holter head traction with the neck extended.  Following shave, prep and drape in the usual sterile fashion, skin incision was made through the midline in the medial border of the sternocleidomastoid muscle on the left side three fingerbreadths above the level of the carotid tubercle.  The platysma was identified, elevated, divided and undermined.  The sternocleidomastoid was identified, and medial dissection revealed the carotid artery which retracted laterally to the left.  The trachea and esophagus retracted laterally to the right, exposing the bones of the anterior cervical spine.  Markers were placed and was found to be at 4-5.  Intraoperative x-ray obtained to confirm correctness level.  Diskectomy was carried out at the level above this. Working at [employer], first under gross observation, the disk was removed, and the operating microscope was brought in.  Working through the posterior longitudinal ligament in the anterior epidural space, the foramen on the  left side at C3-C4 was decompressed and carefully explored.  Following complete decompression, the nerve root was explored and found to be totally free.  On the right side, there was reasonably normal anatomy.  An 8 mm bone graft was fashioned from patellar allograft and tapped into place.  A 14 mm Tether plate was then placed with 12 mm screws, two NC3 and two NC4.  Following placement of screws and graft, intraoperative x-ray showed good positioning.  The wound was irrigated and hemostasis assured.  The platysma was reapproximated with 3-0 Vicryl in an interrupted fashion.  Subcutaneous tissues were reapproximated with 3-0 Vicryl in an interrupted fashion.  Skin was closed with 4-0 Vicryl in a running subcuticular fashion.  Benzoin and Steri-Strips were placed and made occlusive with Telfa and OpSite.  The patient was returned to the recovery room in good condition. DD:  06/06/00 TD:  06/09/00 Job: [id number]

## 2010-10-05 NOTE — Cardiovascular Report (Signed)
[place]. [facility]  Patient:    Shane Peterson, Shane Peterson                      MRN: [id number] Proc. Date: 07/18/00 Adm. Date:  [id number] Attending:  Silvestre Mesi CC:         Lenon Curt. Chilton Si, M.D.  Cath Lab   Cardiac Catheterization  NAME OF PROCEDURES: 1. Left heart catheterization. 2. Coronary cineangiography. 3. Left internal mammary artery cineangiography. 4. Left ventricular cineangiography. 5. Abdominal aortogram. 6. Perclose of the right femoral artery.  CARDIOLOGIST:  Aram Candela. Aleen Campi, M.D.  REFERRING PHYSICIAN:  Lenon Curt. Chilton Si, M.D.  INDICATION FOR PROCEDURES:  This 65 year old male has a history of back pain and neck pain, and has had orthopedic surgery on his back last week.  He had an episode of chest pain during exertion and was referred for treadmill exercise tolerance testing in our office.  His treadmill was early positive for ST segment abnormality and he was scheduled for cardiac cath.  He also has a history of diabetes, hyperlipidemia and hypertension.  PROCEDURE:  After signing an informed consent the patient was premedicated with 50 mg of Benadryl intravenously and brought to the cardiac cath lab.  His right groin was prepped and draped in a sterile fashion and anesthetized locally with 1% lidocaine.  A 6 French introducer sheath was inserted percutaneously into the right femoral artery.  Six [place] #4 Judkins coronary catheters were used to make injections into the coronary arteries.  The right coronary catheter was used to make a midstream injection into the left subclavian artery visualizing the left internal mammary artery.  A 6 French pigtail catheter was used to measure pressures in the left ventricle and aorta.  The patient tolerated the procedure well and no complications were noted.  At the end of the procedure the catheter and sheath were removed from right femoral artery, and hemostasis was easily obtained with  a Perclose closure system.  MEDICATIONS GIVEN:  Fentanyl 50 mg IV.  HEMODYNAMIC DATA:  Left ventricular pressure 170/10-26.  Aortic pressure 169/94 with a mean of 120.  Left ventricular ejection fraction 60%.  CINE FINDINGS: CORONARY CINEANGIOGRAPHY:  Left coronary artery:  The ostium appears normal. The distal portion of the left main coronary artery has a severe focal eccentric stenotic lesion, which appears to be approximately 70-80% stenotic.  Left anterior descending:  The takeoff of the LAD has a severe stenosis of 80%.  The remainder of the LAD is essentially normal without significant stenosis.  There is some minor plaque in the middle segment.  Circumflex coronary artery:  The takeoff of the circumflex has a mild plaque causing a 40% stenosis.  The first obtuse marginal branch has a severe stenosis with a focal eccentric lesion of 80% stenosis.  The second obtuse marginal branch is a larger branch and is essentially normal.  Right coronary artery:  The ostium appears normal.  The right coronary artery has very minor plaque in its proximal and middle segments, but no significant stenosis.  There is very normal antegrade flow into the distal segment.  The distal right coronary artery is a large dominant vessel supplying the posterior descending and posterolateral circulation.  There is a minor plaque in the middle segment of the posterior descending causing an eccentric focal lesion of 20-30%.  There is normal antegrade flow through this lesion.  Left internal mammary artery:  Appears normal.  LEFT VENTRICULAR CINEANGIOGRAM:  The  left ventricle has a normal contractility pattern without segmental abnormality.  The wall thickness appears normal. The ejection fraction was estimated at 60%.  The mitral and aortic valves appear normal.  ABDOMINAL AORTOGRAM:  The abdominal aorta and renal arteries appear normal.  FINAL DIAGNOSES:  1. Two-vessel coronary artery disease.  2.  Left main coronary artery stenosis.  3. Proximal left anterior descending coronary artery stenosis.  4. First obtuse marginal stenosis.  5. Minor plaque in the right coronary artery.  6. Normal left internal mammary artery.  7. Normal left ventricular function with ejection fraction 60%.  8. Normal abdominal aorta and renal arteries.  9. Normal mitral and aortic valves. 10. Successful Perclose of the right femoral artery.  DISPOSITION:  With the severe left main coronary artery lesion he will need coronary artery bypass graft surgery and we have notified [facility] of his coronary status.  Will admit to the hospital prior to surgery because of the severity of his left main coronary artery disease. DD:  07/18/00 TD:  07/21/00 Job: [id number]

## 2010-10-05 NOTE — Cardiovascular Report (Signed)
. [facility]  Patient:    Shane Peterson, Shane Peterson Visit Number: [id number] MRN: [id number]          Service Type: MED Location: [facility] 2899 01 Attending Physician:  Eleanora Neighbor Dictated by:   Colleen Can Deborah Chalk, M.D. Proc. Date: 08/06/01 Admit Date:  08/05/2001 Discharge Date: 08/06/2001                          Cardiac Catheterization  REFERRING PHYSICIAN:  Art Chilton Si, M.D.  HISTORY:  Shane Peterson had a previous coronary artery bypass grafting a year ago. He presents with recurrent chest discomfort.  Myocardial infarction was ruled out.  He is referred for catheterization evaluation of grafts.  PROCEDURE:  Left heart catheterization with selective coronary angiography, left ventricular angiography with perclose.  TYPE AND SITE OF ENTRY:  Percutaneous right femoral artery.  CATHETERS:  A 6-French 4 curved Judkins right and left coronary catheter, 6-French pigtail ventricular adaptic catheter.  CONTRAST MATERIAL:  Omnipaque.  MEDICATION GIVEN PRIOR TO PROCEDURE:  Valium 10 mg p.o.  MEDICATIONS GIVEN DURING PROCEDURE: 1. Versed 2 mg IV. 2. Ancef 1 gram IV.  COMMENTS:  The patient tolerated the procedure well.  HEMODYNAMIC DATA:  The aortic pressure was 154/85, LV was 143/20.  There was no aortic valve gradient noted on pullback.  ANGIOGRAPHIC DATA: 1.  Left main coronary artery: The left main coronary artery has a 70 percent     distal narrowing. 2.  Left anterior descending: The left anterior descending has a 70-80 percent     ostial narrowing of approximately 1 cm in length.  There is no distal     flow and it is a bidirectional flow into a diagonal, as well as the left     anterior descending.  One branch of the diagonal vessel is the grafted     branch.  There is one diagonal that fills in an antegrade fashion but flow     through this 70-80 percent proximal disease in the left circumflex appears     to be satisfactory. 3.  Left  circumflex: The left circumflex has 30 percent ostial narrowing and     irregularities otherwise.  Distal marginal vessels have bypass grafts. 4.  Right coronary artery: The right coronary artery is a reasonably large     vessel with irregularities.  There is a 70-80 percent stenosis at the     origin of an acute marginal vessel which could be a potential site of     ischemia.  The continuation branch of the right coronary artery is     satisfactory.  There is bidirectional flow into a distal posterior     descending artery. 5.  Saphenous vein graft to the left circumflex marginal one and marginal two     is widely patent with a nice insertion and good distal runoff.  The body     of the graft is smooth. 6.  Saphenous vein graft to the diagonal vessel is widely patent with a nice     insertion and good distal runoff. 7.  Saphenous vein graft to posterior descending vessel is widely patent with     a satisfactory insertion.  There is some disease just distal to the     insertion site but it appears to have satisfactory flow.  The     characteristics are not critically narrowed.  The narrowing may be in the  40 percent range.  Overall, this is a satisfactory system. 8.  Left internal mammary graft to the LAD is widely patent with a nice     insertion and good distal runoff. 9.  Left ventricular angiogram: Left ventricular angiogram was performed in     the RAO position.  Overall cardiac size and silhouette are normal.  Left     ventricular function is normal.  OVERALL IMPRESSION: 1. Normal left ventricular function. 2. Severe three vessel disease including left main stenosis. 3. Satisfactory coronary artery bypass graft patency with a patent    saphenous vein graft to posterior descending, patent sequential    saphenous vein graft to the obtuse marginal one and obtuse marginal    two, patent saphenous vein graft to the diagonal system, and a patent    left internal mammary artery to  the LAD.  DISCUSSION:  It is felt that Shane Peterson probably has a satisfactory grafting. We can try to manage him medically somewhat but I doubt that he has any significant underlying ischemia given the nature of the satisfactory grafts. Dictated by:   Colleen Can Deborah Chalk, M.D. Attending Physician:  Eleanora Neighbor DD:  08/06/01 TD:  08/07/01 Job: [id number]

## 2010-10-05 NOTE — H&P (Signed)
[place]. [facility]  Patient:    Shane Peterson, Shane Peterson                        MRN: [id number] Adm. Date:  06/06/00 Attending:  Payton Doughty, M.D.                         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  HISTORY OF PRESENT ILLNESS: The patient is a 65 year old right-handed white gentleman with a long history of neck pain I have been seeing since last February.  He had had foraminotomy done by Dr. Elesa Hacker for left shoulder and arm pain and did not really get very much better.  He had an MRI that showed spondylitic disease at L3-4 and at L5-6.  The patient did some traction and improved slightly but then had a recurrence of his pain and cannot turn his head to the left, and has a lot of pain that is isolated to the left trapezius region.  This does not go down his arm and he does not have any neurologic deficit associated with it.  He is admitted now for anterior cervical diskectomy and fusion at C3-C4.  PAST MEDICAL HISTORY: Unremarkable.  CURRENT MEDICATIONS: Inderal 40 mg q.d. for dysrhythmia.  ALLERGIES: SULFA.  SOCIAL HISTORY: He does not smoke and does not drink.  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  FAMILY HISTORY: Mother is age 18 and in good health, with angina.  Father is deceased, with myocardial infarction.  REVIEW OF SYSTEMS: Remarkable for glasses, hearing loss, tinnitus, difficulties with balance and nasal congestion, occasional angina and arm weakness, back pain, neck pain, and diabetes.  PHYSICAL EXAMINATION:  HEENT: Within normal limits.  NECK: He has reasonable range of motion of his neck with limited motion, especially with turn toward the left, and this does reproduce his left shoulder pain.  CHEST: Clear.  CARDIAC: Regular rate and rhythm.  ABDOMEN: Nontender.  No hepatosplenomegaly.  EXTREMITIES: No cyanosis or edema.  Peripheral pulses good.  GU: Examination  deferred.  NEUROLOGIC: He is awake and alert and oriented.  Cranial nerves intact.  Motor examination shows 5/5 strength through the upper and lower extremities. Lhermittes sign is negative.  LABORATORY DATA: Repeat MRI shows increasing encumbrance of left C3 root in the neural foramen at L3-4.  PLAN: The plan is for an anterior cervical diskectomy and fusion at C3-C4. The risks and benefits of this approach have been discussed with him and he wishes to proceed. DD:  06/06/00 TD:  06/06/00 Job: [id number]

## 2010-10-05 NOTE — Discharge Summary (Signed)
[place]. [facility]  Patient:    Shane Peterson, Shane Peterson                      MRN: [id number] Adm. Date:  [id number] Disc. Date: 07/25/00 Attending:  Mikey Bussing Dictator:   Sherrie George, P.A. CC:         Aram Candela. Aleen Campi, M.D.   Referring Physician Discharge [place]  DATE OF BIRTH:  [birth year]  REFERRING AND ADMITTING PHYSICIAN:  Aram Candela. Aleen Campi, M.D.  ADMISSION DIAGNOSES: 1. Chest pain with positive treadmill study. 2. Adult onset diabetes mellitus, non-insulin dependent and diet controlled. 3. Hyperlipidemia. 4. History of palpitations and paroxysmal supraventricular tachycardia. 5. History of gastroesophageal reflux disease and hiatal hernia. 6. Recent cervical disk surgery.  DISCHARGE DIAGNOSES: 1. Progressive angina with an 80% left main and 99% proximal left anterior    descending. 2. Adult onset diabetes mellitus, non-insulin dependent and diet controlled. 3. Hyperlipidemia. 4. Palpitations/paroxysmal supraventricular tachycardia. 5. Gastroesophageal reflux disease/hiatal hernia. 6. Status post cervical disk surgery.  PROCEDURES: 1. Cardiac catheterization on July 18, 2000, by Aram Candela. Tysinger, M.D. 2. Coronary artery bypass grafting x 5 with left internal mammary artery to    the LAD, saphenous vein graft to the ramus, saphenous vein graft to OM1 and    OM2 sequentially, and saphenous vein graft to posterior descending on    July 21, 2000, by Mikey Bussing, M.D.  BRIEF HISTORY:  The patient is a 65 year old white male, a medical patient of Aram Candela. Tysinger, M.D., with chest pain while mowing his lawn.  He had no radiation and no nausea or other symptoms at that time.  He has had a recurrence of this with discomfort radiating to both shoulders and this has become somewhat progressive.  He recently underwent a stress test by Aram Candela. Tysinger, M.D., which was apparently positive.  After reviewing this, it was recommended that he  undergo cardiac catheterization and he was admitted electively for the same.  PAST MEDICAL HISTORY:  A three-year history of non-insulin-dependent diabetes mellitus, diet controlled.  Hyperlipidemia.  Palpitations/PSVT.  History of hiatal hernia, GERD, and duodenal ulcer in the past.  History of psoriasis. He has had some difficulty hearing.  PAST SURGICAL HISTORY:  Recent cervical surgery.  Tonsillectomy. Appendectomy.  ALLERGIES:  SULFA and TOPROL, but no reactions are noted.  MEDICATIONS ON ADMISSION: 1. Propranolol 40 mg p.o. b.i.d. 2. Plavix 75 mg q.d. 3. Ecotrin 325 mg one q.d. 4. Zocor 40 mg q.d. h.s. 5. Nitroglycerin 0.4 mg every three to five minutes p.r.n. for chest pain. 6. Altace 5 mg q.d.  For further history and physical, please see the dictated note.  HOSPITAL COURSE:  The patient was admitted and underwent cardiac catheterization which showed significant stenosis of the left internal mammary artery and the left main.  The left main was thought to be 80% and the LAD was 90%.  There was 80% left circumflex and a 75% distal posterior descending coronary artery.  It was Dr. Aram Candela. Tysingers opinion that the patient should undergo coronary artery bypass grafting and a consult with Mikey Bussing, M.D., was obtained.  The patient was reviewed and evaluated by Dr. Donata Clay.  He agreed that the patient had significant left main and three-vessel disease with progressive angina.  It was his plan to take the patient to the OR and do radial artery and left internal mammary artery grafts.  The risks and  benefits were discussed and informed operative consent was obtained.  Routine preoperative studies, including Dopplers showed both right and left diminished wave forms with ulnar compression.  The patient was also found to have a 60-80% left ICA stenosis in the upper end of scale. Vertebral flow was antegrade.  There was no evidence of significant right ICA stenosis.   After reviewing these studies, the patient was scheduled and taken to the operating room on July 21, 2000.  At that time, the patient underwent coronary artery bypass grafting with left internal mammary artery to the LAD, saphenous vein graft to the ramus, saphenous vein graft to OM1 and OM2 sequentially, and saphenous vein graft to posterior descending on July 21, 2000, by Mikey Bussing, M.D.  The patient tolerated the procedure well and returned to the intensive care unit in satisfactory condition.  He was extubated that evening and was neurologically intact.  Cardiac output was 4 L/min.  On the first postoperative day, the patient was in sinus rhythm, the cardiac index was 2.3, and O2 saturations were 98% on room air.  He was mobilized.  The lines were removed.  He was transferred to floor.  On the second postoperative day, the patient continued to show good progress with no significant problems.  On the third postoperative day, the patient continued to show good progress.  He complained of his pain pills causing sleepiness, dizziness, and hallucinations.  He was taken off Percocet and Darvocet and placed on Ultram and Vioxx.  He has continued to make good progress.  His sugars have been relatively well controlled.  Yesterday on his second postoperative day, they were 145, 190, 124, and 148.  He was placed back on his insulin.  Currently his wounds are healing nicely and overall he has done quite well.  It was Dr. Kathlee Nations Trigts opinion that if he continued to do well and had no further problems, he could have his pacing wires removed in the a.m. on July 25, 2000, and could be discharged home as well.  DISCHARGE ACTIVITY:  Light to moderate.  No lifting over 10 pounds.  No driving.  No strenuous activity.  WOUND CARE:  He is to clean his incisions with plain soap and water.  DISCHARGE MEDICATIONS: 1. Vioxx 25 mg q.d. x 5 days. 2. Ultram 50 mg one to two p.o. q.4h. p.r.n. 3.  Altace 2.5 mg q.d. 4. Multivitamins with iron one q.d. 5. Tenormin 25 mg half of a tablet q.12h.  6. Potassium chloride 20 mEq q.d. 7. Lasix 40 mg q.d., #20. 8. Ecotrin 325 mg one q.d. 9. Zocor 20 mg p.o. h.s. p.r.n.  FOLLOW-UP:  The patient will return for staple removal on Friday, August 01, 2000, at 10 a.m.  He will follow up to see Mikey Bussing, M.D., the following week.  The patient is instructed to contact John R. Tysinger, M.D., for follow-up in two weeks and have his medications adjusted by Dr. Aleen Campi. The patient is also to contact Erskine Speed, M.D., for follow-up.  DISCHARGE LABORATORY DATA:  Electrolytes are normal.  The BUN is 14 and creatinine 0.8.  The calcium is 8.1 and glucose 130.  The white count is 7.1, platelets 113,000, hemoglobin 9.8, and hematocrit 21.  CONDITION ON DISCHARGE:  Improving. DD:  07/24/00 TD:  07/24/00 Job: [id number]

## 2011-07-09 ENCOUNTER — Telehealth: Payer: Self-pay | Admitting: Nurse Practitioner

## 2011-07-09 NOTE — Telephone Encounter (Signed)
New msg: Pt wife calling wanting to speak with nurse needing her to check on something with Lawson Fiscal. Please return pt wife call to discuss further.

## 2011-07-09 NOTE — Telephone Encounter (Signed)
Wife and pt wanting to know the difference between ischemic heart disease and At Fib and if they are related.  I explained the differences in the two.  Wife states understanding.

## 2011-08-07 ENCOUNTER — Encounter: Payer: Self-pay | Admitting: *Deleted

## 2011-10-22 ENCOUNTER — Encounter: Payer: Self-pay | Admitting: Cardiology

## 2012-06-11 ENCOUNTER — Ambulatory Visit (INDEPENDENT_AMBULATORY_CARE_PROVIDER_SITE_OTHER): Payer: Medicare Other | Admitting: Cardiology

## 2012-06-11 ENCOUNTER — Encounter: Payer: Self-pay | Admitting: Cardiology

## 2012-06-11 VITALS — BP 142/78 | Ht 69.0 in | Wt 202.1 lb

## 2012-06-11 DIAGNOSIS — E785 Hyperlipidemia, unspecified: Secondary | ICD-10-CM

## 2012-06-11 DIAGNOSIS — I2510 Atherosclerotic heart disease of native coronary artery without angina pectoris: Secondary | ICD-10-CM

## 2012-06-11 DIAGNOSIS — I4949 Other premature depolarization: Secondary | ICD-10-CM

## 2012-06-11 DIAGNOSIS — I10 Essential (primary) hypertension: Secondary | ICD-10-CM

## 2012-06-11 DIAGNOSIS — I493 Ventricular premature depolarization: Secondary | ICD-10-CM | POA: Insufficient documentation

## 2012-06-11 NOTE — Progress Notes (Signed)
Shane Peterson Date of Birth: 05/09/1946 Medical Record #[id number]  History of Present Illness: Mr. Picklesimer is seen today for evaluation. He he is a pleasant 67 year old white male with known history of coronary disease. He has previously been followed here by Dr. Deborah Chalk. He underwent coronary bypass surgery in 2002 by Dr. Kathlee Nations Tright. This included an LIMA graft to the LAD, saphenous vein graft to the diagonal, saphenous vein graft sequentially to the OM 1 and OM 2. Saphenous vein graft to the PDA. He had follow cardiac catheterization in 2008 which showed all his grafts were patent. He had a nuclear stress test here in 2010 which was normal. He reports a nuclear stress test in 2012 at the [place] which was normal. His primary complaints is of palpitations. He was admitted to the hospital here in 2012. He was noted to have PVCs. 24-hour Holter monitor showed occasional PVCs. He was placed on diltiazem with improvement. He was also taking atenolol at that time. Since then his atenolol was switched to propranolol to also treat a tremor. Now he continues to have palpitations that may occur any time. He describes this as a hard or extra beat. Sometimes is associated with shortness of breath. He feels fatigued. His tremor has improved. Recently he has experienced symptoms of tightness in his back rating down both arms and shortness of breath. He had complete blood work in October which was unremarkable. His activity is limited by peripheral neuropathy and plantar fasciitis.  Current Outpatient Prescriptions on File Prior to Visit  Medication Sig Dispense Refill  . diltiazem (CARDIZEM CD) 120 MG 24 hr capsule Take 1 capsule (120 mg total) by mouth daily.  30 capsule  3  . glipiZIDE (GLUCOTROL) 5 MG tablet Take 5 mg by mouth as directed.      [name] lisinopril (PRINIVIL,ZESTRIL) 5 MG tablet Take 20 mg by mouth daily.       [name] loratadine (CLARITIN) 10 MG tablet Take 10 mg by mouth daily.      . nitroGLYCERIN  (NITROSTAT) 0.4 MG SL tablet Place 0.4 mg under the tongue every 5 (five) minutes as needed.      [name] omeprazole (PRILOSEC) 20 MG capsule Take 20 mg by mouth daily.      . pravastatin (PRAVACHOL) 20 MG tablet Take 20 mg by mouth daily.      . propranolol (INDERAL) 20 MG tablet Take 20 mg by mouth as directed.        Allergies  Allergen Reactions  . Codeine   . Sulfonamide Derivatives     Past Medical History  Diagnosis Date  . HTN (hypertension)   . IHD (ischemic heart disease)   . HLD (hyperlipidemia)   . Palpitations   . DM (diabetes mellitus)   . PVC's (premature ventricular contractions)     Past Surgical History  Procedure Date  . Cervical disc surgery 2002  . Coronary artery bypass graft 2002  . Appendectomy 1962    History  Smoking status  . Never Smoker   Smokeless tobacco  . Not on file    History  Alcohol Use No    Family History  Problem Relation Age of Onset  . Heart attack    . Atrial fibrillation    . Hypertension    . Cerebral palsy      Review of Systems: The review of systems is positive for diabetes with neuropathy.  All other systems were reviewed and are negative.  Physical Exam: BP  142/78  Ht 5\' 9"  (1.753 m)  Wt 202 lb 1.9 oz (91.681 kg)  BMI 29.85 kg/m2 He is a pleasant, overweight white male in no acute distress.The patient is alert and oriented x 3.  The mood and affect are normal.  The skin is warm and dry.  Color is normal.  The HEENT exam reveals that the sclera are nonicteric.  The mucous membranes are moist.  The carotids are 2+ without bruits.  There is no thyromegaly.  There is no JVD.  The lungs are clear.  The chest wall is non tender.  The heart exam reveals a regular rate with a normal S1 and S2.  There are no murmurs, gallops, or rubs.  The PMI is not displaced.   Abdominal exam reveals good bowel sounds.  There is no guarding or rebound.  There is no hepatosplenomegaly or tenderness.  There are no masses.  Exam of the legs  reveal no clubbing, cyanosis, or edema.  The legs are without rashes.  The distal pulses are intact.  Cranial nerves II - XII are intact. No significant tremor today. Motor and sensory functions are intact.  The gait is normal.  LABORATORY DATA: ECG shows marked sinus bradycardia with a rate of 50 beats per minute. It is otherwise normal.  Assessment / Plan: 1. Coronary disease status post CABG in 2002. I am concerned that his recent symptoms of back and arm tightness may be anginal equivalent symptoms. I recommended a followup nuclear stress test since it has been 2 years since his last study. He will either schedule this at the [place] or with [place].  2. PVCs. If his stress test is normal then we will reassure him that these are benign. I am concerned that his heart rate is too slow on his current medications and this may be exacerbating his symptoms of fatigue. I recommended reducing his propranolol to 10 mg twice a day. We will see how this impacts his symptoms of fatigue, palpitations, and tremor.  3. Hypertension, controlled.  4. Hyperlipidemia. Blood work was reviewed from the [place] and his cholesterol levels are at goal on pravastatin.  5. Diabetes mellitus type 2.  6. GERD.

## 2012-06-11 NOTE — Patient Instructions (Signed)
Reduce Inderal (propranolol) to 10 mg twice a day.  We'll see how your symptoms of fatigue, palpitations, and tremor do on this lower dose.  I recommend a nuclear stress test. Let me know if you want [place] to schedule.

## 2012-09-23 ENCOUNTER — Encounter: Payer: Self-pay | Admitting: Cardiology

## 2012-09-24 ENCOUNTER — Encounter: Payer: Self-pay | Admitting: Cardiology

## 2013-10-14 DIAGNOSIS — H9190 Unspecified hearing loss, unspecified ear: Secondary | ICD-10-CM | POA: Insufficient documentation

## 2013-10-14 DIAGNOSIS — E1165 Type 2 diabetes mellitus with hyperglycemia: Secondary | ICD-10-CM | POA: Insufficient documentation

## 2013-10-14 DIAGNOSIS — M545 Low back pain, unspecified: Secondary | ICD-10-CM | POA: Insufficient documentation

## 2013-10-14 DIAGNOSIS — G629 Polyneuropathy, unspecified: Secondary | ICD-10-CM | POA: Insufficient documentation

## 2013-10-14 DIAGNOSIS — G8929 Other chronic pain: Secondary | ICD-10-CM | POA: Insufficient documentation

## 2014-09-15 ENCOUNTER — Encounter: Payer: Self-pay | Admitting: Internal Medicine

## 2014-09-15 ENCOUNTER — Ambulatory Visit (INDEPENDENT_AMBULATORY_CARE_PROVIDER_SITE_OTHER): Payer: Non-veteran care | Admitting: Internal Medicine

## 2014-09-15 ENCOUNTER — Encounter: Payer: Self-pay | Admitting: *Deleted

## 2014-09-15 VITALS — BP 118/74 | HR 50 | Temp 97.6°F | Resp 14 | Ht 68.0 in | Wt 192.8 lb

## 2014-09-15 DIAGNOSIS — E1159 Type 2 diabetes mellitus with other circulatory complications: Secondary | ICD-10-CM

## 2014-09-15 DIAGNOSIS — E1151 Type 2 diabetes mellitus with diabetic peripheral angiopathy without gangrene: Secondary | ICD-10-CM

## 2014-09-15 DIAGNOSIS — E1165 Type 2 diabetes mellitus with hyperglycemia: Principal | ICD-10-CM

## 2014-09-15 LAB — HEMOGLOBIN A1C: Hgb A1c MFr Bld: 7.4 % — ABNORMAL HIGH (ref 4.6–6.5)

## 2014-09-15 MED ORDER — GLIPIZIDE ER 5 MG PO TB24: 5.0000 mg | ORAL_TABLET | Freq: Every day | ORAL | Status: DC

## 2014-09-15 NOTE — Progress Notes (Signed)
Patient ID: Shane Peterson, male   DOB: [birth year], 69 y.o.   MRN: [id number]  HPI: Shane Peterson is a 69 y.o.-year-old male, referred by his PCP, Dr. Reubin Peterson, from [facility], for management of DM2, probably 2/2 Agent Orange, dx in 1998, non-insulin-dependent, uncontrolled, with complications (CAD).  Last hemoglobin A1c was: 05/2014: 10% (reportedly) Lab Results  Component Value Date   HGBA1C * 08/05/2010    6.8 (NOTE)                                                                       According to the ADA Clinical Practice Recommendations for 2011, when HbA1c is used as a screening test:   >=6.5%   Diagnostic of Diabetes Mellitus           (if abnormal result  is confirmed)  5.7-6.4%   Increased risk of developing Diabetes Mellitus  References:Diagnosis and Classification of Diabetes Mellitus,Diabetes UTML,4650,35(WSFKC 1):S62-S69 and Standards of Medical Care in         Diabetes - 2011,Diabetes LEXN,1700,17  (Suppl 1):S11-S61.   HGBA1C * 01/14/2009    7.9 (NOTE) The ADA recommends the following therapeutic goal for glycemic control related to Hgb A1c measurement: Goal of therapy: <6.5 Hgb A1c  Reference: American Diabetes Association: Clinical Practice Recommendations 2010, Diabetes Care, 2010, 33: (Suppl  1).   PCP offered him insulin, however the patient refused.  Pt is on a regimen of: - Januvia 100 mg in am He tried: - Glipizide >> worked, stopped by [place] in 07/2014 - Metformin regular or XR >> diarrhea  Pt checks his sugars 1-2x a day and they are: - am: 82-214 - 2h after b'fast: n/c - before lunch: n/c - 2h after lunch: n/c - before dinner: 74-170 - 2h after dinner: n/c - bedtime: n/c - nighttime: n/c No lows. Lowest sugar was 214; he has hypoglycemia awareness at 78.  Highest sugar was 60s.  Glucometer: ?  Pt's meals are: - Breakfast: 1 egg + cup of coffee - snack: 1 pack of crackers - Lunch: banana or ham sandwich - Dinner: avoids starches; meat +  veggie - Snacks: 1 in am  - no CKD, last BUN/creatinine:  Lab Results  Component Value Date   BUN 16 08/05/2010   CREATININE 0.88 08/05/2010  On Lisinopril. Was on Cardizem >> taken off by his cardiologist. - last set of lipids: Lab Results  Component Value Date   CHOL  08/05/2010    159        ATP III CLASSIFICATION:  <200     mg/dL   Desirable  200-239  mg/dL   Borderline High  >=240    mg/dL   High          HDL 30* 08/05/2010   LDLCALC * 08/05/2010    112        Total Cholesterol/HDL:CHD Risk Coronary Heart Disease Risk Table                     Men   Women  1/2 Average Risk   3.4   3.3  Average Risk       5.0   4.4  2 X Average Risk   9.6  7.1  3 X Average Risk  23.4   11.0        Use the calculated Patient Ratio above and the CHD Risk Table to determine the patient's CHD Risk.        ATP III CLASSIFICATION (LDL):  <100     mg/dL   Optimal  100-129  mg/dL   Near or Above                    Optimal  130-159  mg/dL   Borderline  160-189  mg/dL   High  >190     mg/dL   Very High   TRIG 84 08/05/2010   CHOLHDL 5.3 08/05/2010  on Pravastatin 20 mg.  - last eye exam was in 06/2014. No DR.  - + numbness and tingling in his legs from knee down and now fingers.  Pt has no FH of DM.  ROS: Constitutional: + weight loss, + fatigue, no subjective hyperthermia/hypothermia, + nocturia, + poor sleep Eyes: + blurry vision, no xerophthalmia ENT: no sore throat, no nodules palpated in throat, no dysphagia/odynophagia, no hoarseness, + decreased hearing, + tinnitus Cardiovascular: no CP/SOB/+ palpitations/no leg swelling Respiratory: no cough/SOB Gastrointestinal: no N/V/D/C/+ heartburn Musculoskeletal: + both: muscle/joint aches Skin: no rashes Neurological: + tremors (not while on Propranolol)/numbness/tingling/dizziness Psychiatric: no depression/anxiety  Past Medical History  Diagnosis Date  . HTN (hypertension)   . IHD (ischemic heart disease)   . HLD  (hyperlipidemia)   . Palpitations   . DM (diabetes mellitus)   . PVC's (premature ventricular contractions)    Past Surgical History  Procedure Laterality Date  . Cervical disc surgery  2002  . Coronary artery bypass graft  2002  . Appendectomy  1962   History   Social History  . Marital Status: Married    Spouse Name: N/A  . Number of Children: 2   Occupational History  . retired    Social History Main Topics  . Smoking status: Never Smoker   . Smokeless tobacco: Not on file  . Alcohol Use: Yes, occasionally  . Drug Use: No   Current Outpatient Prescriptions on File Prior to Visit  Medication Sig Dispense Refill  . capsaicin (ZOSTRIX) 0.025 % cream Apply topically as directed.    . Cyanocobalamin 1000 MCG/ML KIT Inject as directed as directed.    . diltiazem (CARDIZEM CD) 120 MG 24 hr capsule Take 1 capsule (120 mg total) by mouth daily. 30 capsule 3  . glipiZIDE (GLUCOTROL) 5 MG tablet Take 5 mg by mouth as directed.    [name] lisinopril (PRINIVIL,ZESTRIL) 5 MG tablet Take 20 mg by mouth daily.     [name] loratadine (CLARITIN) 10 MG tablet Take 10 mg by mouth daily.    . methocarbamol (ROBAXIN) 500 MG tablet Take 500 mg by mouth as directed.    . naproxen (NAPROSYN) 500 MG tablet Take 500 mg by mouth as directed.    . nitroGLYCERIN (NITROSTAT) 0.4 MG SL tablet Place 0.4 mg under the tongue every 5 (five) minutes as needed.    [name] omeprazole (PRILOSEC) 20 MG capsule Take 20 mg by mouth daily.    . pravastatin (PRAVACHOL) 20 MG tablet Take 20 mg by mouth daily.    . propranolol (INDERAL) 20 MG tablet Take 20 mg by mouth as directed.    . traMADol (ULTRAM) 50 MG tablet Take 50 mg by mouth every 6 (six) hours as needed.     No current facility-administered medications on file  prior to visit.   Allergies  Allergen Reactions  . Codeine   . Sulfonamide Derivatives    Family History  Problem Relation Age of Onset  . Heart attack    . Atrial fibrillation    . Hypertension    .  Cerebral palsy     PE: BP 118/74 mmHg  Pulse 50  Temp(Src) 97.6 F (36.4 C) (Oral)  Resp 14  Ht 5' 8"  (1.727 m)  Wt 192 lb 12.8 oz (87.454 kg)  BMI 29.32 kg/m2  SpO2 96% Wt Readings from Last 3 Encounters:  09/15/14 192 lb 12.8 oz (87.454 kg)  06/11/12 202 lb 1.9 oz (91.681 kg)  05/10/08 209 lb 4 oz (94.915 kg)   Constitutional: overweight, in NAD Eyes: PERRLA, EOMI, no exophthalmos ENT: moist mucous membranes, no thyromegaly, no cervical lymphadenopathy Cardiovascular: RRR, No MRG Respiratory: CTA B Gastrointestinal: abdomen soft, NT, ND, BS+ Musculoskeletal: no deformities, strength intact in all 4 Skin: moist, warm, no rashes Neurological: no tremor with outstretched hands, DTR normal in all 4  ASSESSMENT: 1. DM2, non-insulin-dependent, uncontrolled, with complications - CAD  PLAN:  1. Patient with long-standing, uncontrolled diabetes, on oral antidiabetic regimen (Januvia), which became insufficient - higher sugars in am. Unclear how high sugars are at bedtime, but this is his largest meal of the day so it is conceivable sugars are high then. I advised him to check sugars at bedtime, too. I will add Glipizide XL 5 mg in am. We may need to add a low dose regular Glipizide before dinner. - I suggested to:  Patient Instructions  Please continue: - Januvia 100 mg in am  Please start: - Glipizide XL 5 mg in am before breakfast  Please stop at the lab.  - continue checking sugars at different times of the day - check 2 times a day, rotating checks - given sugar log and advised how to fill it and to bring it at next appt  - given foot care handout and explained the principles  - given instructions for hypoglycemia management "15-15 rule"  - advised for yearly eye exams >> he is UTD - check Hba1c today - Return to clinic in 1 mo with sugar log   Office Visit on 09/15/2014  Component Date Value Ref Range Status  . Hgb A1c MFr Bld 09/15/2014 7.4* 4.6 - 6.5 % Final    Glycemic Control Guidelines for People with Diabetes:Non Diabetic:  <6%Goal of Therapy: <7%Additional Action Suggested:  >8%    Hemoglobin A1c not far from target. Continue with plan to add glipizide as mentioned above.

## 2014-09-15 NOTE — Patient Instructions (Addendum)
Please continue: - Januvia 100 mg in am  Please start: - Glipizide XL 5 mg in am before breakfast  Please stop at the lab.  PATIENT INSTRUCTIONS FOR TYPE 2 DIABETES:  **Please join MyChart!** - see attached instructions about how to join if you have not done so already.  DIET AND EXERCISE Diet and exercise is an important part of diabetic treatment.  We recommended aerobic exercise in the form of brisk walking (working between 40-60% of maximal aerobic capacity, similar to brisk walking) for 150 minutes per week (such as 30 minutes five days per week) along with 3 times per week performing 'resistance' training (using various gauge rubber tubes with handles) 5-10 exercises involving the major muscle groups (upper body, lower body and core) performing 10-15 repetitions (or near fatigue) each exercise. Start at half the above goal but build slowly to reach the above goals. If limited by weight, joint pain, or disability, we recommend daily walking in a swimming pool with water up to waist to reduce pressure from joints while allow for adequate exercise.    BLOOD GLUCOSES Monitoring your blood glucoses is important for continued management of your diabetes. Please check your blood glucoses 2-4 times a day: fasting, before meals and at bedtime (you can rotate these measurements - e.g. one day check before the 3 meals, the next day check before 2 of the meals and before bedtime, etc.).   HYPOGLYCEMIA (low blood sugar) Hypoglycemia is usually a reaction to not eating, exercising, or taking too much insulin/ other diabetes drugs.  Symptoms include tremors, sweating, hunger, confusion, headache, etc. Treat IMMEDIATELY with 15 grams of Carbs: . 4 glucose tablets .  cup regular juice/soda . 2 tablespoons raisins . 4 teaspoons sugar . 1 tablespoon honey Recheck blood glucose in 15 mins and repeat above if still symptomatic/blood glucose <100.  RECOMMENDATIONS TO REDUCE YOUR RISK OF DIABETIC  COMPLICATIONS: * Take your prescribed MEDICATION(S) * Follow a DIABETIC diet: Complex carbs, fiber rich foods, (monounsaturated and polyunsaturated) fats * AVOID saturated/trans fats, high fat foods, >2,300 mg salt per day. * EXERCISE at least 5 times a week for 30 minutes or preferably daily.  * DO NOT SMOKE OR DRINK more than 1 drink a day. * Check your FEET every day. Do not wear tightfitting shoes. Contact us if you develop an ulcer * See your EYE doctor once a year or more if needed * Get a FLU shot once a year * Get a PNEUMONIA vaccine once before and once after age 68 years  GOALS:  * Your Hemoglobin A1c of <7%  * fasting sugars need to be <130 * after meals sugars need to be <180 (2h after you start eating) * Your Systolic BP should be 140 or lower  * Your Diastolic BP should be 80 or lower  * Your HDL (Good Cholesterol) should be 40 or higher  * Your LDL (Bad Cholesterol) should be 100 or lower. * Your Triglycerides should be 150 or lower  * Your Urine microalbumin (kidney function) should be <30 * Your Body Mass Index should be 25 or lower    Please consider the following ways to cut down carbs and fat and increase fiber and micronutrients in your diet: - substitute whole grain for white bread or pasta - substitute Macaluso rice for white rice - substitute 90-calorie flat bread pieces for slices of bread when possible - substitute sweet potatoes or yams for white potatoes - substitute humus for margarine - substitute  tofu for cheese when possible - substitute almond or rice milk for regular milk (would not drink soy milk daily due to concern for soy estrogen influence on breast cancer risk) - substitute dark chocolate for other sweets when possible - substitute water - can add lemon or orange slices for taste - for diet sodas (artificial sweeteners will trick your body that you can eat sweets without getting calories and will lead you to overeating and weight gain in the long  run) - do not skip breakfast or other meals (this will slow down the metabolism and will result in more weight gain over time)  - can try smoothies made from fruit and almond/rice milk in am instead of regular breakfast - can also try old-fashioned (not instant) oatmeal made with almond/rice milk in am - order the dressing on the side when eating salad at a restaurant (pour less than half of the dressing on the salad) - eat as little meat as possible - can try juicing, but should not forget that juicing will get rid of the fiber, so would alternate with eating raw veg./fruits or drinking smoothies - use as little oil as possible, even when using olive oil - can dress a salad with a mix of balsamic vinegar and lemon juice, for e.g. - use agave nectar, stevia sugar, or regular sugar rather than artificial sweateners - steam or broil/roast veggies  - snack on veggies/fruit/nuts (unsalted, preferably) when possible, rather than processed foods - reduce or eliminate aspartame in diet (it is in diet sodas, chewing gum, etc) Read the labels!  Try to read Dr. Katherina Right book: "Program for Reversing Diabetes" for other ideas for healthy eating.

## 2014-09-23 ENCOUNTER — Other Ambulatory Visit: Payer: Self-pay | Admitting: *Deleted

## 2014-09-23 MED ORDER — GLIPIZIDE ER 5 MG PO TB24: 5.0000 mg | ORAL_TABLET | Freq: Every day | ORAL | Status: DC

## 2014-09-23 NOTE — Telephone Encounter (Signed)
Print per Dr Elvera LennoxGherghe.

## 2014-10-06 DIAGNOSIS — L719 Rosacea, unspecified: Secondary | ICD-10-CM | POA: Insufficient documentation

## 2014-10-27 ENCOUNTER — Ambulatory Visit: Payer: Non-veteran care | Admitting: Internal Medicine

## 2015-04-03 ENCOUNTER — Encounter: Payer: Self-pay | Admitting: Gastroenterology

## 2017-02-17 DIAGNOSIS — M66241 Spontaneous rupture of extensor tendons, right hand: Secondary | ICD-10-CM | POA: Insufficient documentation

## 2017-03-10 ENCOUNTER — Encounter (HOSPITAL_COMMUNITY): Payer: Self-pay | Admitting: Emergency Medicine

## 2017-03-10 ENCOUNTER — Emergency Department (HOSPITAL_COMMUNITY): Payer: Non-veteran care

## 2017-03-10 ENCOUNTER — Inpatient Hospital Stay (HOSPITAL_COMMUNITY)
Admission: EM | Admit: 2017-03-10 | Discharge: 2017-03-15 | DRG: 871 | Disposition: A | Discharge status: Home Health | Payer: Non-veteran care | Attending: Student in an Organized Health Care Education/Training Program | Admitting: Student in an Organized Health Care Education/Training Program

## 2017-03-10 DIAGNOSIS — E871 Hypo-osmolality and hyponatremia: Secondary | ICD-10-CM | POA: Diagnosis present

## 2017-03-10 DIAGNOSIS — R4182 Altered mental status, unspecified: Secondary | ICD-10-CM

## 2017-03-10 DIAGNOSIS — Z8249 Family history of ischemic heart disease and other diseases of the circulatory system: Secondary | ICD-10-CM

## 2017-03-10 DIAGNOSIS — F419 Anxiety disorder, unspecified: Secondary | ICD-10-CM | POA: Diagnosis present

## 2017-03-10 DIAGNOSIS — E872 Acidosis, unspecified: Secondary | ICD-10-CM | POA: Diagnosis present

## 2017-03-10 DIAGNOSIS — L821 Other seborrheic keratosis: Secondary | ICD-10-CM

## 2017-03-10 DIAGNOSIS — I447 Left bundle-branch block, unspecified: Secondary | ICD-10-CM | POA: Diagnosis not present

## 2017-03-10 DIAGNOSIS — K219 Gastro-esophageal reflux disease without esophagitis: Secondary | ICD-10-CM | POA: Diagnosis present

## 2017-03-10 DIAGNOSIS — I259 Chronic ischemic heart disease, unspecified: Secondary | ICD-10-CM | POA: Diagnosis not present

## 2017-03-10 DIAGNOSIS — A419 Sepsis, unspecified organism: Secondary | ICD-10-CM

## 2017-03-10 DIAGNOSIS — Z951 Presence of aortocoronary bypass graft: Secondary | ICD-10-CM

## 2017-03-10 DIAGNOSIS — E119 Type 2 diabetes mellitus without complications: Secondary | ICD-10-CM

## 2017-03-10 DIAGNOSIS — Z882 Allergy status to sulfonamides status: Secondary | ICD-10-CM

## 2017-03-10 DIAGNOSIS — I2510 Atherosclerotic heart disease of native coronary artery without angina pectoris: Secondary | ICD-10-CM

## 2017-03-10 DIAGNOSIS — R6520 Severe sepsis without septic shock: Secondary | ICD-10-CM | POA: Diagnosis present

## 2017-03-10 DIAGNOSIS — I21A1 Myocardial infarction type 2: Secondary | ICD-10-CM | POA: Diagnosis present

## 2017-03-10 DIAGNOSIS — E876 Hypokalemia: Secondary | ICD-10-CM | POA: Diagnosis present

## 2017-03-10 DIAGNOSIS — D72810 Lymphocytopenia: Secondary | ICD-10-CM

## 2017-03-10 DIAGNOSIS — E669 Obesity, unspecified: Secondary | ICD-10-CM | POA: Diagnosis not present

## 2017-03-10 DIAGNOSIS — R7881 Bacteremia: Secondary | ICD-10-CM | POA: Diagnosis not present

## 2017-03-10 DIAGNOSIS — I350 Nonrheumatic aortic (valve) stenosis: Secondary | ICD-10-CM | POA: Diagnosis not present

## 2017-03-10 DIAGNOSIS — B965 Pseudomonas (aeruginosa) (mallei) (pseudomallei) as the cause of diseases classified elsewhere: Secondary | ICD-10-CM | POA: Diagnosis present

## 2017-03-10 DIAGNOSIS — G9341 Metabolic encephalopathy: Secondary | ICD-10-CM | POA: Diagnosis present

## 2017-03-10 DIAGNOSIS — Z885 Allergy status to narcotic agent status: Secondary | ICD-10-CM

## 2017-03-10 DIAGNOSIS — I10 Essential (primary) hypertension: Secondary | ICD-10-CM | POA: Diagnosis present

## 2017-03-10 DIAGNOSIS — Z7951 Long term (current) use of inhaled steroids: Secondary | ICD-10-CM

## 2017-03-10 DIAGNOSIS — Z820 Family history of epilepsy and other diseases of the nervous system: Secondary | ICD-10-CM

## 2017-03-10 DIAGNOSIS — Z7982 Long term (current) use of aspirin: Secondary | ICD-10-CM

## 2017-03-10 DIAGNOSIS — D696 Thrombocytopenia, unspecified: Secondary | ICD-10-CM | POA: Diagnosis not present

## 2017-03-10 DIAGNOSIS — G9340 Encephalopathy, unspecified: Secondary | ICD-10-CM | POA: Diagnosis not present

## 2017-03-10 DIAGNOSIS — R011 Cardiac murmur, unspecified: Secondary | ICD-10-CM

## 2017-03-10 DIAGNOSIS — E785 Hyperlipidemia, unspecified: Secondary | ICD-10-CM | POA: Diagnosis present

## 2017-03-10 DIAGNOSIS — Z7984 Long term (current) use of oral hypoglycemic drugs: Secondary | ICD-10-CM

## 2017-03-10 DIAGNOSIS — I480 Paroxysmal atrial fibrillation: Secondary | ICD-10-CM | POA: Diagnosis present

## 2017-03-10 DIAGNOSIS — Z1623 Resistance to quinolones and fluoroquinolones: Secondary | ICD-10-CM | POA: Diagnosis present

## 2017-03-10 DIAGNOSIS — Z6828 Body mass index (BMI) 28.0-28.9, adult: Secondary | ICD-10-CM

## 2017-03-10 DIAGNOSIS — A4151 Sepsis due to Escherichia coli [E. coli]: Principal | ICD-10-CM | POA: Diagnosis present

## 2017-03-10 DIAGNOSIS — D61818 Other pancytopenia: Secondary | ICD-10-CM | POA: Diagnosis present

## 2017-03-10 DIAGNOSIS — Z79899 Other long term (current) drug therapy: Secondary | ICD-10-CM

## 2017-03-10 DIAGNOSIS — Z9049 Acquired absence of other specified parts of digestive tract: Secondary | ICD-10-CM

## 2017-03-10 HISTORY — DX: Family history of other specified conditions: Z84.89

## 2017-03-10 HISTORY — DX: Nausea with vomiting, unspecified: R11.2

## 2017-03-10 HISTORY — DX: Atherosclerotic heart disease of native coronary artery without angina pectoris: I25.10

## 2017-03-10 HISTORY — DX: Type 2 diabetes mellitus without complications: E11.9

## 2017-03-10 HISTORY — DX: Gastro-esophageal reflux disease without esophagitis: K21.9

## 2017-03-10 HISTORY — DX: Other specified postprocedural states: Z98.890

## 2017-03-10 LAB — CBC WITH DIFFERENTIAL/PLATELET
BASOS PCT: 0 %
Basophils Absolute: 0 10*3/uL (ref 0.0–0.1)
Eosinophils Absolute: 0.1 10*3/uL (ref 0.0–0.7)
Eosinophils Relative: 3 %
HEMATOCRIT: 44.6 % (ref 39.0–52.0)
HEMOGLOBIN: 15.1 g/dL (ref 13.0–17.0)
LYMPHS PCT: 7 %
Lymphs Abs: 0.2 10*3/uL — ABNORMAL LOW (ref 0.7–4.0)
MCH: 29.3 pg (ref 26.0–34.0)
MCHC: 33.9 g/dL (ref 30.0–36.0)
MCV: 86.4 fL (ref 78.0–100.0)
MONOS PCT: 2 %
Monocytes Absolute: 0.1 10*3/uL (ref 0.1–1.0)
NEUTROS ABS: 2.2 10*3/uL (ref 1.7–7.7)
Neutrophils Relative %: 88 %
PLATELETS: 117 10*3/uL — AB (ref 150–400)
RBC: 5.16 MIL/uL (ref 4.22–5.81)
RDW: 13.6 % (ref 11.5–15.5)
WBC Morphology: INCREASED
WBC: 2.6 10*3/uL — ABNORMAL LOW (ref 4.0–10.5)

## 2017-03-10 LAB — URINALYSIS, ROUTINE W REFLEX MICROSCOPIC
BACTERIA UA: NONE SEEN
BILIRUBIN URINE: NEGATIVE
Glucose, UA: 50 mg/dL — AB
KETONES UR: NEGATIVE mg/dL
LEUKOCYTES UA: NEGATIVE
Nitrite: NEGATIVE
Protein, ur: 100 mg/dL — AB
SQUAMOUS EPITHELIAL / LPF: NONE SEEN
Specific Gravity, Urine: 1.013 (ref 1.005–1.030)
pH: 7 (ref 5.0–8.0)

## 2017-03-10 LAB — COMPREHENSIVE METABOLIC PANEL
ALBUMIN: 4.1 g/dL (ref 3.5–5.0)
ALK PHOS: 108 U/L (ref 38–126)
ALT: 53 U/L (ref 17–63)
ANION GAP: 10 (ref 5–15)
AST: 39 U/L (ref 15–41)
BUN: 14 mg/dL (ref 6–20)
CHLORIDE: 99 mmol/L — AB (ref 101–111)
CO2: 26 mmol/L (ref 22–32)
Calcium: 9.6 mg/dL (ref 8.9–10.3)
Creatinine, Ser: 0.93 mg/dL (ref 0.61–1.24)
GFR calc Af Amer: >60 mL/min (ref >60)
GFR calc non Af Amer: >60 mL/min (ref >60)
GLUCOSE: 196 mg/dL — AB (ref 65–99)
POTASSIUM: 4.3 mmol/L (ref 3.5–5.1)
SODIUM: 135 mmol/L (ref 135–145)
Total Bilirubin: 1.2 mg/dL (ref 0.3–1.2)
Total Protein: 7 g/dL (ref 6.5–8.1)

## 2017-03-10 LAB — I-STAT CG4 LACTIC ACID, ED
LACTIC ACID, VENOUS: 3.08 mmol/L — AB (ref 0.5–1.9)
Lactic Acid, Venous: 1.89 mmol/L (ref 0.5–1.9)

## 2017-03-10 LAB — INFLUENZA PANEL BY PCR (TYPE A & B)
INFLAPCR: NEGATIVE
Influenza B By PCR: NEGATIVE

## 2017-03-10 LAB — I-STAT TROPONIN, ED: TROPONIN I, POC: 0.01 ng/mL (ref 0.00–0.08)

## 2017-03-10 LAB — TROPONIN I
Troponin I: 0.18 ng/mL (ref <0.03)
Troponin I: 0.19 ng/mL (ref <0.03)

## 2017-03-10 LAB — GLUCOSE, CAPILLARY
GLUCOSE-CAPILLARY: 215 mg/dL — AB (ref 65–99)
Glucose-Capillary: 173 mg/dL — ABNORMAL HIGH (ref 65–99)
Glucose-Capillary: 169 mg/dL — ABNORMAL HIGH (ref 65–99)

## 2017-03-10 MED ORDER — INSULIN ASPART 100 UNIT/ML ~~LOC~~ SOLN
0.0000 [IU] | Freq: Every day | SUBCUTANEOUS | Status: DC
Start: 2017-03-10 — End: 2017-03-15
  Administered 2017-03-12 – 2017-03-13 (×2): 2 [IU] via SUBCUTANEOUS

## 2017-03-10 MED ORDER — GABAPENTIN 100 MG PO CAPS
100.0000 mg | ORAL_CAPSULE | Freq: Every day | ORAL | Status: DC
  Administered 2017-03-10 – 2017-03-14 (×5): 100 mg via ORAL
  Filled 2017-03-10 (×5): qty 1

## 2017-03-10 MED ORDER — SODIUM CHLORIDE 0.9 % IV BOLUS (SEPSIS)
1000.0000 mL | Freq: Once | INTRAVENOUS | Status: AC
  Administered 2017-03-10: 1000 mL via INTRAVENOUS

## 2017-03-10 MED ORDER — CEFTRIAXONE SODIUM 2 G IJ SOLR
2.0000 g | Freq: Once | INTRAMUSCULAR | Status: AC
  Administered 2017-03-10: 2 g via INTRAVENOUS
  Filled 2017-03-10: qty 2

## 2017-03-10 MED ORDER — ACETAMINOPHEN 325 MG RE SUPP
325.0000 mg | RECTAL | Status: DC | PRN
  Filled 2017-03-10: qty 1

## 2017-03-10 MED ORDER — ACETAMINOPHEN 325 MG PO TABS
325.0000 mg | ORAL_TABLET | ORAL | Status: DC | PRN
  Administered 2017-03-11: 325 mg via ORAL
  Filled 2017-03-10 (×2): qty 1

## 2017-03-10 MED ORDER — SODIUM CHLORIDE 0.9 % IV SOLN
INTRAVENOUS | Status: AC
  Administered 2017-03-10: 16:00:00 via INTRAVENOUS

## 2017-03-10 MED ORDER — LISINOPRIL 20 MG PO TABS: 40.0000 mg | ORAL_TABLET | Freq: Every day | ORAL | Status: DC

## 2017-03-10 MED ORDER — INSULIN ASPART 100 UNIT/ML ~~LOC~~ SOLN
0.0000 [IU] | Freq: Three times a day (TID) | SUBCUTANEOUS | Status: DC
  Administered 2017-03-10 – 2017-03-12 (×7): 3 [IU] via SUBCUTANEOUS
  Administered 2017-03-13: 8 [IU] via SUBCUTANEOUS
  Administered 2017-03-13: 5 [IU] via SUBCUTANEOUS
  Administered 2017-03-13: 3 [IU] via SUBCUTANEOUS
  Administered 2017-03-14: 5 [IU] via SUBCUTANEOUS
  Administered 2017-03-14: 8 [IU] via SUBCUTANEOUS
  Administered 2017-03-14 – 2017-03-15 (×2): 5 [IU] via SUBCUTANEOUS

## 2017-03-10 MED ORDER — ALPRAZOLAM 0.25 MG PO TABS: 0.5000 mg | ORAL_TABLET | Freq: Every evening | ORAL | Status: DC | PRN

## 2017-03-10 MED ORDER — PROPRANOLOL HCL 40 MG PO TABS: 40.0000 mg | ORAL_TABLET | Freq: Three times a day (TID) | ORAL | Status: DC

## 2017-03-10 MED ORDER — PRAVASTATIN SODIUM 40 MG PO TABS
20.0000 mg | ORAL_TABLET | Freq: Every day | ORAL | Status: DC
  Administered 2017-03-10 – 2017-03-14 (×5): 20 mg via ORAL
  Filled 2017-03-10 (×5): qty 1

## 2017-03-10 MED ORDER — DEXTROSE 5 % IV SOLN
500.0000 mg | Freq: Once | INTRAVENOUS | Status: AC
  Administered 2017-03-10: 500 mg via INTRAVENOUS
  Filled 2017-03-10: qty 500

## 2017-03-10 MED ORDER — CEFTRIAXONE SODIUM 2 G IJ SOLR: 2.0000 g | INTRAMUSCULAR | Status: DC

## 2017-03-10 MED ORDER — ACETAMINOPHEN 325 MG PO TABS
650.0000 mg | ORAL_TABLET | Freq: Once | ORAL | Status: AC
  Administered 2017-03-10: 650 mg via ORAL
  Filled 2017-03-10: qty 2

## 2017-03-10 MED ORDER — METHOCARBAMOL 500 MG PO TABS: 500.0000 mg | ORAL_TABLET | Freq: Three times a day (TID) | ORAL | Status: DC | PRN

## 2017-03-10 NOTE — ED Provider Notes (Signed)
[name] 5W MEDICAL Provider Note   CSN: [id number] Arrival date & time: 03/10/17  4098   LEVEL 5 CAVEAT - ALTERED MENTAL STATUS  History   Chief Complaint Chief Complaint  Patient presents with  . Fever  . Chills    HPI Shane Peterson is a 71 y.o. male.  HPI  71 year old male with a history of diabetes and prior CABG presents with acute chills and fever.  He states he was driving to the [place] for an appointment for his right wrist which has been a chronic problem and he is due to have surgery.  He states he was called and told that his appointment was canceled for unknown reasons.  On the way home he felt tremors and chills.  EMS noted a temporal temperature of 100.2.  He vomited/spit up mildly.  He states that he had acute chest pain and low back pain.  The low back pain is not new for him and the chest pain was relatively brief.  He had first denies coughing but then later states the chest pain was worse when coughing.  He denies any urinary symptoms.  No headache, neck pain, or new back pain.  No abdominal pain.  Past Medical History:  Diagnosis Date  . DM (diabetes mellitus) (HCC)   . HLD (hyperlipidemia)   . HTN (hypertension)   . IHD (ischemic heart disease)   . Palpitations   . PVC's (premature ventricular contractions)     Patient Active Problem List   Diagnosis Date Noted  . Lactic acidosis 03/10/2017  . Altered mental status 03/10/2017  . PVCs (premature ventricular contractions) 06/11/2012  . HYPERLIPIDEMIA 05/10/2008  . ANXIETY 05/10/2008  . HYPERTENSION 05/10/2008  . CORONARY ARTERY DISEASE 05/10/2008  . DIVERTICULOSIS, COLON 05/10/2008  . ARTHRITIS 05/10/2008  . DIARRHEA 05/10/2008  . ABDOMINAL PAIN-LLQ 05/10/2008  . DIABETES MELLITUS, BORDERLINE, CONTROLLED 05/10/2008  . ARRHYTHMIA, HX OF 05/10/2008  . GERD 05/09/2008  . HIATAL HERNIA 05/09/2008  . CHOLELITHIASIS 05/09/2008    Past Surgical History:  Procedure Laterality Date    . APPENDECTOMY  1962  . CERVICAL DISC SURGERY  2002  . CORONARY ARTERY BYPASS GRAFT  2002       Home Medications    Prior to Admission medications   Medication Sig Start Date End Date Taking? Authorizing Provider  amLODipine (NORVASC) 10 MG tablet Take 10 mg by mouth daily.   Yes [provider]  aspirin 81 MG chewable tablet Chew 81 mg by mouth daily.   Yes [provider]  cetirizine (ZYRTEC) 10 MG tablet Take 10 mg by mouth at bedtime.    Yes [provider]  Cholecalciferol (VITAMIN D) 2000 UNITS tablet Take 2,000 Units by mouth daily.   Yes [provider]  cyanocobalamin (,VITAMIN B-12,) 1000 MCG/ML injection Inject 1,000 mcg into the muscle every 30 (thirty) days.   Yes [provider]  esomeprazole (NEXIUM) 40 MG capsule Take 40 mg by mouth 2 (two) times daily before a meal.   Yes [provider]  fluticasone (FLONASE) 50 MCG/ACT nasal spray Place 1 spray into both nostrils daily as needed for allergies or rhinitis.   Yes [provider]  gabapentin (NEURONTIN) 100 MG capsule Take 300-400 mg by mouth See admin instructions. 300mg  in am, 400mg  in pm   Yes [provider]  glipiZIDE (GLUCOTROL) 5 MG tablet Take 10 mg by mouth 3 (three) times daily.    Yes [provider]  lisinopril (PRINIVIL,ZESTRIL) 40 MG tablet Take 40 mg by mouth daily.   Yes [provider]  meloxicam (MOBIC) 15 MG tablet Take 15 mg by mouth daily.   Yes [provider]  methocarbamol (ROBAXIN) 500 MG tablet Take 500 mg by mouth every 8 (eight) hours as needed for muscle spasms.    Yes [provider]  nitroGLYCERIN (NITROSTAT) 0.4 MG SL tablet Place 0.4 mg under the tongue every 5 (five) minutes as needed for chest pain.    Yes [provider]  Omega-3 Fatty Acids (FISH OIL) 1000 MG CAPS Take 3 capsules by mouth 2 (two) times daily.    Yes [provider]  pravastatin (PRAVACHOL) 40 MG  tablet Take 40 mg by mouth daily.   Yes [provider]  propranolol (INDERAL) 20 MG tablet Take 40 mg by mouth 3 (three) times daily.    Yes [provider]  sitaGLIPtin (JANUVIA) 100 MG tablet Take 100 mg by mouth daily.   Yes [provider]  sodium chloride (OCEAN) 0.65 % SOLN nasal spray Place 1 spray into both nostrils as needed for congestion.   Yes [provider]  diltiazem (CARDIZEM CD) 120 MG 24 hr capsule Take 1 capsule (120 mg total) by mouth daily. Patient not taking: Reported on 09/15/2014 09/04/10   Roger Shelter, MD  glipiZIDE (GLIPIZIDE XL) 5 MG 24 hr tablet Take 1 tablet (5 mg total) by mouth daily with breakfast. Patient not taking: Reported on 03/10/2017 09/23/14   Carlus Pavlov, MD    Family History Family History  Problem Relation Age of Onset  . Heart attack Unknown   . Atrial fibrillation Unknown   . Hypertension Unknown   . Cerebral palsy Unknown     Social History Social History  Substance Use Topics  . Smoking status: Never Smoker  . Smokeless tobacco: Not on file  . Alcohol use No     Allergies   Codeine and Sulfonamide derivatives   Review of Systems Review of Systems  Constitutional: Positive for chills and fever.  Respiratory: Positive for cough and shortness of breath.   Cardiovascular: Positive for chest pain.  Gastrointestinal: Positive for vomiting.  Genitourinary: Negative for dysuria.  Neurological: Negative for headaches.  All other systems reviewed and are negative.    Physical Exam Updated Vital Signs BP (!) 106/53   Pulse 73   Temp (!) 102.3 F (39.1 C) (Rectal)   Resp 15   SpO2 93%   Physical Exam  Constitutional: He appears well-developed and well-nourished.  HENT:  Head: Normocephalic and atraumatic.  Right Ear: External ear normal.  Left Ear: External ear normal.  Nose: Nose normal.  Eyes: Pupils are equal, round, and reactive to light. EOM are normal. Right eye exhibits no  discharge. Left eye exhibits no discharge.  Neck: Normal range of motion. Neck supple.  Cardiovascular: Normal rate and regular rhythm.   Murmur heard. Pulmonary/Chest: Effort normal and breath sounds normal.  Abdominal: Soft. There is no tenderness.  Musculoskeletal: He exhibits no edema.  Neurological: He is alert. He is disoriented.  Awake, alert, but disoriented. Oriented to person and place, disoriented to time. CN 3-12 grossly intact. 5/5 strength in all 4 extremities. Grossly normal sensation.   Skin: Skin is warm and dry.  Nursing note and vitals reviewed.    ED Treatments / Results  Labs (all labs ordered are listed, but only abnormal results are displayed) Labs Reviewed  COMPREHENSIVE METABOLIC PANEL - Abnormal; Notable for the  following:       Result Value   Chloride 99 (*)    Glucose, Bld 196 (*)    All other components within normal limits  CBC WITH DIFFERENTIAL/PLATELET - Abnormal; Notable for the following:    WBC 2.6 (*)    Platelets 117 (*)    Lymphs Abs 0.2 (*)    All other components within normal limits  URINALYSIS, ROUTINE W REFLEX MICROSCOPIC - Abnormal; Notable for the following:    Glucose, UA 50 (*)    Hgb urine dipstick SMALL (*)    Protein, ur 100 (*)    All other components within normal limits  I-STAT CG4 LACTIC ACID, ED - Abnormal; Notable for the following:    Lactic Acid, Venous 3.08 (*)    All other components within normal limits  CULTURE, BLOOD (ROUTINE X 2)  CULTURE, BLOOD (ROUTINE X 2)  URINE CULTURE  INFLUENZA PANEL BY PCR (TYPE A & B)  TROPONIN I  TROPONIN I  TROPONIN I  HIV ANTIBODY (ROUTINE TESTING)  I-STAT TROPONIN, ED  CBG MONITORING, ED  I-STAT CG4 LACTIC ACID, ED    EKG  EKG Interpretation  Date/Time:  Monday March 10 2017 09:34:38 EDT Ventricular Rate:  80 PR Interval:    QRS Duration: 92 QT Interval:  347 QTC Calculation: 401 R Axis:   81 Text Interpretation:  Sinus rhythm Borderline right axis deviation  Minimal ST depression, diffuse leads ST depression new compared to 2012 Confirmed by Pricilla LovelessGoldston, Lukis Bunt [id number]) on 03/10/2017 10:01:29 AM       Radiology Dg Chest 2 View  Result Date: 03/10/2017 CLINICAL DATA:  Chest pain. EXAM: CHEST  2 VIEW COMPARISON:  Radiographs of August 05, 2010. FINDINGS: The heart size and mediastinal contours are within normal limits. Both lungs are clear. Status post coronary artery bypass graft. No pneumothorax or pleural effusion is noted. The visualized skeletal structures are unremarkable. IMPRESSION: No active cardiopulmonary disease. Electronically Signed   By: Lupita RaiderJames  Green Jr, M.D.   On: 03/10/2017 10:50    Procedures Procedures (including critical care time)  Medications Ordered in ED Medications  ALPRAZolam (XANAX) tablet 0.5 mg (not administered)  gabapentin (NEURONTIN) capsule 100 mg (not administered)  methocarbamol (ROBAXIN) tablet 500 mg (not administered)  pravastatin (PRAVACHOL) tablet 20 mg (not administered)  acetaminophen (TYLENOL) tablet 325 mg (not administered)    Or  acetaminophen (TYLENOL) suppository 325 mg (not administered)  insulin aspart (novoLOG) injection 0-15 Units (not administered)  insulin aspart (novoLOG) injection 0-5 Units (not administered)  0.9 %  sodium chloride infusion (not administered)  cefTRIAXone (ROCEPHIN) 2 g in dextrose 5 % 50 mL IVPB (0 g Intravenous Stopped 03/10/17 1040)  acetaminophen (TYLENOL) tablet 650 mg (650 mg Oral Given 03/10/17 1010)  sodium chloride 0.9 % bolus 1,000 mL (0 mLs Intravenous Stopped 03/10/17 1214)  azithromycin (ZITHROMAX) 500 mg in dextrose 5 % 250 mL IVPB (0 mg Intravenous Stopped 03/10/17 1314)     Initial Impression / Assessment and Plan / ED Course  I have reviewed the triage vital signs and the nursing notes.  Pertinent labs & imaging results that were available during my care of the patient were reviewed by me and considered in my medical decision making (see chart for  details).     Given patient's fever and altered mental status, code sepsis was called.  He urinated shortly after arrival and then after my examination had to urinate again.  Thus with no other clear source of infection, was  started on Rocephin for possible UTI.  However his urine has come back unremarkable.  He has had a little bit of coughing since this morning and thus will add azithromycin to help cover for community-acquired pneumonia.  He has had improved mental status while in the ED.  No meningismus.  Or could be early URI versus influenza.  Given his elevated lactic acid but not quite to septic shock level, he will need fluids, antibiotics, and admission.  Internal medicine teaching service to admit.  Final Clinical Impressions(s) / ED Diagnoses   Final diagnoses:  Sepsis, due to unspecified organism [facility])    New Prescriptions Current Discharge Medication List       Pricilla Loveless, MD 03/10/17 [phone]

## 2017-03-10 NOTE — ED Triage Notes (Addendum)
Pt arrives via [facility] from home for c/o sudden onset of chills and fever as well as some chest pressure. Pt had 324mg  asa and 1 sl nitro. Pt denies any other associated symptoms, noted to be very tremulous upon arrival. A/ox4, resp e/u, nad. Temporal temp 100.2 per EMS. Pt denies chest pain upon arrival.

## 2017-03-10 NOTE — ED Notes (Signed)
ED Provider at bedside. 

## 2017-03-10 NOTE — H&P (Signed)
Date: 03/10/2017               Patient Name:  Shane Peterson MRN: [id number]  DOB: 1945/10/28 Age / Sex: 71 y.o., male   PCP: Patient, No Pcp Per         Medical Service: Internal Medicine Teaching Service         Attending Physician: Dr. Oswaldo DoneVincent, Marquita Palmsuncan Thomas, *    First Contact: Dr. Caron PresumeHelberg Pager: [phone]  Second Contact: Dr. Mikey BussingHoffman Pager: [phone]       After Hours (After 5p/  First Contact Pager: [phone]  weekends / holidays): Second Contact Pager: [phone]   Chief Complaint: Chills  History of Present Illness: Shane Peterson is a 71 y.o. Male with a PMHx significant for DM and CAD who presented to the ED with acute onset chest pain and chills. He states that he was in his normal state of health until late this AM when he developed severe chills. [facility] when he received a call stating his appointment had been canceled. He returned home, went inside and felt extremely cold. He put on two shirts, a flanle shirt, and a blanket but continued to have severe chills. He believes his chest pain is directly related to his chills and more MSK in nature. The chest pain completely resolved with the resolution of his chills. He took 1 nitroglycerin, although he did not feel he was having a heart attack. He has never felt anything like this before and has not been sick recently. Associated symptoms include SOB, and nausea/vomiting. Denies cough, dysuria, increased urinary frequency, diarrhea, abdominal pain.   Denies a history of CVA or seizures. No history of orthostatic hypotension but is on multiple BP medications. History of abnormal heart rates. States that he had episodes of tachycardia then bradycardia. He is now on metoprolol for these episodes. He has had episodes of hypoglycemia in the past but EMS reports a blood glucose of >200 on arrival.   In the ED, the provider reported that he was extremely altered, unable to answer simple questions and was found  to be febrile with an isolated temperature of 103F. His wife at bedside reports that over the last hour he has significantly improved and is now back to his baseline mental status.   Meds:  No outpatient prescriptions have been marked as taking for the 03/10/17 encounter [facility](Hospital Encounter).   Allergies: Allergies as of 03/10/2017 - Review Complete 03/10/2017  Allergen Reaction Noted  . Codeine    . Sulfonamide derivatives     Past Medical History:  Diagnosis Date  . DM (diabetes mellitus) (HCC)   . HLD (hyperlipidemia)   . HTN (hypertension)   . IHD (ischemic heart disease)   . Palpitations   . PVC's (premature ventricular contractions)    Family History:  Brother: + Heart murmur Family: + CAD, A-fib, HTN Daughter: + Cerebral Palsy   Social History:  Runs a food truck Married with a daughter and son  Denies the use of tobacco, EtOH, or illict drugs  Review of Systems: A complete ROS was negative except as per HPI.   Physical Exam: Blood pressure (!) 107/49, pulse 74, temperature (!) 102.3 F (39.1 C), temperature source Rectal, resp. rate 20, SpO2 92 %.  General: Obese male resting comfortably in bed HENT: Atraumatic, normocephalic, moist mucus membranes, oropharynx clear with no exudates Pulm: Good air movement with no crackles or wheezing noted CV: RRR, systolic  murmur with radiate to the carotids noted Abdomen: Active bowel sounds, soft, non-distended, no tenderness to palpation  Extremities: Trace LE edema, pulses palpable in all extremities Skin: No new rashes, seborrheic keratosis noted on the back and scalp Neuro: - Cranial nerves II-XII intact bilaterally  - Gross strength intact in all extremities - Oriented to person, place, and time  EKG: personally reviewed: my interpretation is sinus, normal axis, normal intervals, Q waves in lead III, LBBB noted in V1. No ST elevation or T wave inversion. Stable compared to prior EKG from 2013.   CXR: personally  reviewed: my interpretation is no infiltrates or opacities seen, no cardiomegaly, no increased pulmonary vascular congestion.   Assessment & Plan by Problem: Active Problems:   Lactic acidosis  1. Altered Mental Status. Shane Peterson is a 71 y.o. Male with a PMHx significant for DM and CAD who presented to the ED with severe chills and chest pain. His chest painb had completely resolved with the resolution of his chills. He was found to be febrile and disoriented initially. Mentation has since returned to baseline per wife. Work-up thus far including CXR and UA have not illustrated an infectious cause. Influenza swab and BC have been collected and are pending. Metabolic work-up is pertinent for oxygen saturation of 97% on RA, CBG 196, Na 135, AG 10 and BUN 14. Lactic acid was elevated to 3.08 and trended down to 1.89 on repeat. Ischemic evaluation including EKG and troponin have been negative thus far. Based on his fever, elevated lactic acid, and thrombocytopenia, this patient may have a viral illness (influenza). Although he does lack all other traditional symptoms including myalgias, arthralgias, and HA.  - BC pending  - Influenza swab pending  - Trend troponin  - Discontinue antibiotic therapy at this point   2. Aortic Stenosis  - Patient reports that he has never been told her has a heart murmur - Denies symptoms consistent with aortic stenosis including syncope, angina, or dyspnea with exertion. Not volume overload on exam. No pulsus parvus  - Follows with cardiology as an outpatient  3. Leukopenia and Thrombocytopenia  - Hgb normal at 15.1, total bilirubin of 1.2  - No prior labs to compare  - Both could be a result of a viral illness, will monitor and need outpatient follow-up   4. Diabetes Mellitus  - Home medications include Glipizide 5 mg and Sitagliptin 100 mg, hold both medication  - SSI while in the hospital   5. Hypertension  - Continue Lisinopril   6. Ischemic Heart Disease    - Continue Lisinopril, pravastatin, and propranolol   Diet: Carb modified + Heart Healthy VTE ppx: Lovenox  Code Status: Full code  Dispo: Admit patient to Observation with expected length of stay less than 2 midnights.  SignedLevora Dredge, MD 03/10/2017, 12:47 PM  My Pager: [phone]

## 2017-03-10 NOTE — Progress Notes (Signed)
Pharmacy Antibiotic Note  Heron SabinsCharles E Brabender is a 71 y.o. male admitted on 03/10/2017 with UTI.  Pharmacy has been consulted for ceftriaxone dosing. WBC 2.6. LA 3.08>1.89  Plan: -Ceftriaxone 2 gm IV Q 24 hours -Azithromycin per MD  -Monitor CBC, cultures and clinical progress -Pharmacy to sign off since no further dosage adjustments necessary     Temp (24hrs), Avg:100.9 F (38.3 C), Min:98.2 F (36.8 C), Max:103.5 F (39.7 C)  No results for input(s): WBC, CREATININE, LATICACIDVEN, VANCOTROUGH, VANCOPEAK, VANCORANDOM, GENTTROUGH, GENTPEAK, GENTRANDOM, TOBRATROUGH, TOBRAPEAK, TOBRARND, AMIKACINPEAK, AMIKACINTROU, AMIKACIN in the last 168 hours.  CrCl cannot be calculated (Patient's most recent lab result is older than the maximum 21 days allowed.).    Allergies  Allergen Reactions  . Codeine   . Sulfonamide Derivatives     Antimicrobials this admission: Ceftriaxone 10/22 >>  Azithromycin 10/22>>  Dose adjustments this admission: None   Microbiology results: 10/22 BCx:  10/22 UCx:    Thank you for allowing pharmacy to be a part of this patient's care.  Vinnie LevelBenjamin Sakara Lehtinen, PharmD., BCPS Clinical Pharmacist Pager [phone]

## 2017-03-10 NOTE — ED Notes (Signed)
Admitting at bedside 

## 2017-03-10 NOTE — ED Notes (Signed)
EKG delayed due to patient needing to use restroom when he arrived to room.

## 2017-03-10 NOTE — Progress Notes (Signed)
CRITICAL VALUE ALERT  Critical Value:  Troponin 0.18  Date & Time Notied:  03/10/17  17:52  Provider Notified: Dr. Caron PresumeHelberg  Orders Received/Actions taken: Repeat EKG

## 2017-03-10 NOTE — ED Notes (Addendum)
Pt a/o to person, place and situation, disoriented to time, asking repetitive questions to this RN.

## 2017-03-10 NOTE — ED Notes (Signed)
Patient transported to X-ray 

## 2017-03-11 ENCOUNTER — Observation Stay (HOSPITAL_COMMUNITY): Payer: Non-veteran care

## 2017-03-11 DIAGNOSIS — D61818 Other pancytopenia: Secondary | ICD-10-CM | POA: Diagnosis present

## 2017-03-11 DIAGNOSIS — I259 Chronic ischemic heart disease, unspecified: Secondary | ICD-10-CM | POA: Diagnosis not present

## 2017-03-11 DIAGNOSIS — E871 Hypo-osmolality and hyponatremia: Secondary | ICD-10-CM | POA: Diagnosis present

## 2017-03-11 DIAGNOSIS — I350 Nonrheumatic aortic (valve) stenosis: Secondary | ICD-10-CM | POA: Diagnosis present

## 2017-03-11 DIAGNOSIS — R7881 Bacteremia: Secondary | ICD-10-CM

## 2017-03-11 DIAGNOSIS — I10 Essential (primary) hypertension: Secondary | ICD-10-CM | POA: Diagnosis present

## 2017-03-11 DIAGNOSIS — E669 Obesity, unspecified: Secondary | ICD-10-CM | POA: Diagnosis present

## 2017-03-11 DIAGNOSIS — E785 Hyperlipidemia, unspecified: Secondary | ICD-10-CM | POA: Diagnosis present

## 2017-03-11 DIAGNOSIS — A419 Sepsis, unspecified organism: Secondary | ICD-10-CM

## 2017-03-11 DIAGNOSIS — R6520 Severe sepsis without septic shock: Secondary | ICD-10-CM | POA: Diagnosis present

## 2017-03-11 DIAGNOSIS — A4152 Sepsis due to Pseudomonas: Secondary | ICD-10-CM | POA: Diagnosis not present

## 2017-03-11 DIAGNOSIS — R011 Cardiac murmur, unspecified: Secondary | ICD-10-CM | POA: Diagnosis not present

## 2017-03-11 DIAGNOSIS — R251 Tremor, unspecified: Secondary | ICD-10-CM | POA: Diagnosis not present

## 2017-03-11 DIAGNOSIS — Z885 Allergy status to narcotic agent status: Secondary | ICD-10-CM | POA: Diagnosis not present

## 2017-03-11 DIAGNOSIS — A4151 Sepsis due to Escherichia coli [E. coli]: Secondary | ICD-10-CM | POA: Diagnosis present

## 2017-03-11 DIAGNOSIS — I480 Paroxysmal atrial fibrillation: Secondary | ICD-10-CM | POA: Diagnosis present

## 2017-03-11 DIAGNOSIS — I4891 Unspecified atrial fibrillation: Secondary | ICD-10-CM | POA: Diagnosis not present

## 2017-03-11 DIAGNOSIS — Z951 Presence of aortocoronary bypass graft: Secondary | ICD-10-CM | POA: Diagnosis not present

## 2017-03-11 DIAGNOSIS — G9341 Metabolic encephalopathy: Secondary | ICD-10-CM | POA: Diagnosis present

## 2017-03-11 DIAGNOSIS — K219 Gastro-esophageal reflux disease without esophagitis: Secondary | ICD-10-CM | POA: Diagnosis present

## 2017-03-11 DIAGNOSIS — E119 Type 2 diabetes mellitus without complications: Secondary | ICD-10-CM | POA: Diagnosis present

## 2017-03-11 DIAGNOSIS — Z1639 Resistance to other specified antimicrobial drug: Secondary | ICD-10-CM | POA: Diagnosis not present

## 2017-03-11 DIAGNOSIS — B965 Pseudomonas (aeruginosa) (mallei) (pseudomallei) as the cause of diseases classified elsewhere: Secondary | ICD-10-CM | POA: Diagnosis present

## 2017-03-11 DIAGNOSIS — F419 Anxiety disorder, unspecified: Secondary | ICD-10-CM | POA: Diagnosis present

## 2017-03-11 DIAGNOSIS — E876 Hypokalemia: Secondary | ICD-10-CM | POA: Diagnosis present

## 2017-03-11 DIAGNOSIS — I2510 Atherosclerotic heart disease of native coronary artery without angina pectoris: Secondary | ICD-10-CM | POA: Diagnosis present

## 2017-03-11 DIAGNOSIS — E872 Acidosis: Secondary | ICD-10-CM | POA: Diagnosis present

## 2017-03-11 DIAGNOSIS — I21A1 Myocardial infarction type 2: Secondary | ICD-10-CM | POA: Diagnosis present

## 2017-03-11 DIAGNOSIS — Z882 Allergy status to sulfonamides status: Secondary | ICD-10-CM | POA: Diagnosis not present

## 2017-03-11 DIAGNOSIS — Z823 Family history of stroke: Secondary | ICD-10-CM | POA: Diagnosis not present

## 2017-03-11 DIAGNOSIS — Z7982 Long term (current) use of aspirin: Secondary | ICD-10-CM | POA: Diagnosis not present

## 2017-03-11 DIAGNOSIS — E1151 Type 2 diabetes mellitus with diabetic peripheral angiopathy without gangrene: Secondary | ICD-10-CM | POA: Diagnosis not present

## 2017-03-11 LAB — URINE CULTURE: CULTURE: NO GROWTH

## 2017-03-11 LAB — GLUCOSE, CAPILLARY
GLUCOSE-CAPILLARY: 156 mg/dL — AB (ref 65–99)
Glucose-Capillary: 167 mg/dL — ABNORMAL HIGH (ref 65–99)
Glucose-Capillary: 167 mg/dL — ABNORMAL HIGH (ref 65–99)
Glucose-Capillary: 160 mg/dL — ABNORMAL HIGH (ref 65–99)

## 2017-03-11 LAB — BASIC METABOLIC PANEL
ANION GAP: 11 (ref 5–15)
BUN: 13 mg/dL (ref 6–20)
CALCIUM: 8.3 mg/dL — AB (ref 8.9–10.3)
CO2: 20 mmol/L — AB (ref 22–32)
Chloride: 102 mmol/L (ref 101–111)
Creatinine, Ser: 1 mg/dL (ref 0.61–1.24)
GFR calc Af Amer: >60 mL/min (ref >60)
GFR calc non Af Amer: >60 mL/min (ref >60)
GLUCOSE: 144 mg/dL — AB (ref 65–99)
Potassium: 3.3 mmol/L — ABNORMAL LOW (ref 3.5–5.1)
Sodium: 133 mmol/L — ABNORMAL LOW (ref 135–145)

## 2017-03-11 LAB — BLOOD CULTURE ID PANEL (REFLEXED)
ACINETOBACTER BAUMANNII: NOT DETECTED
CANDIDA ALBICANS: NOT DETECTED
CANDIDA GLABRATA: NOT DETECTED
CANDIDA TROPICALIS: NOT DETECTED
Candida krusei: NOT DETECTED
Candida parapsilosis: NOT DETECTED
Carbapenem resistance: NOT DETECTED
ENTEROBACTER CLOACAE COMPLEX: NOT DETECTED
ESCHERICHIA COLI: DETECTED — AB
Enterobacteriaceae species: DETECTED — AB
Enterococcus species: NOT DETECTED
HAEMOPHILUS INFLUENZAE: NOT DETECTED
Klebsiella oxytoca: NOT DETECTED
Klebsiella pneumoniae: NOT DETECTED
LISTERIA MONOCYTOGENES: NOT DETECTED
NEISSERIA MENINGITIDIS: NOT DETECTED
PROTEUS SPECIES: NOT DETECTED
PSEUDOMONAS AERUGINOSA: DETECTED — AB
STREPTOCOCCUS AGALACTIAE: NOT DETECTED
STREPTOCOCCUS PNEUMONIAE: NOT DETECTED
STREPTOCOCCUS PYOGENES: NOT DETECTED
STREPTOCOCCUS SPECIES: NOT DETECTED
Serratia marcescens: NOT DETECTED
Staphylococcus aureus (BCID): NOT DETECTED
Staphylococcus species: NOT DETECTED

## 2017-03-11 LAB — ECHOCARDIOGRAM COMPLETE
CHL CUP DOP CALC LVOT VTI: 27.4 cm
E decel time: 208 msec
HEIGHTINCHES: 68 in
LAVOLA4C: 58.2 mL
LV TDI E'LATERAL: 10
LV e' LATERAL: 10 cm/s
LVOT peak grad rest: 7 mmHg
LVOT peak vel: 131 cm/s
MV Dec: 208
MV pk E vel: 1 m/s
RV LATERAL S' VELOCITY: 7.62 cm/s
RV sys press: 37 mmHg
Reg peak vel: 292 cm/s
TAPSE: 15.5 mm
TDI e' medial: 7.83
TRMAXVEL: 292 cm/s

## 2017-03-11 LAB — CBC
HEMATOCRIT: 38 % — AB (ref 39.0–52.0)
Hemoglobin: 12.7 g/dL — ABNORMAL LOW (ref 13.0–17.0)
MCH: 29.1 pg (ref 26.0–34.0)
MCHC: 33.4 g/dL (ref 30.0–36.0)
MCV: 87.2 fL (ref 78.0–100.0)
Platelets: 89 10*3/uL — ABNORMAL LOW (ref 150–400)
RBC: 4.36 MIL/uL (ref 4.22–5.81)
RDW: 13.7 % (ref 11.5–15.5)
WBC: 3.4 10*3/uL — AB (ref 4.0–10.5)

## 2017-03-11 LAB — HIV ANTIBODY (ROUTINE TESTING W REFLEX): HIV Screen 4th Generation wRfx: NONREACTIVE

## 2017-03-11 LAB — TROPONIN I: Troponin I: 0.12 ng/mL (ref <0.03)

## 2017-03-11 MED ORDER — POTASSIUM CHLORIDE CRYS ER 20 MEQ PO TBCR
40.0000 meq | EXTENDED_RELEASE_TABLET | Freq: Every day | ORAL | Status: DC
  Administered 2017-03-11: 40 meq via ORAL
  Filled 2017-03-11: qty 2

## 2017-03-11 MED ORDER — DEXTROSE 5 % IV SOLN
2.0000 g | Freq: Three times a day (TID) | INTRAVENOUS | Status: DC
  Administered 2017-03-11 – 2017-03-15 (×14): 2 g via INTRAVENOUS
  Filled 2017-03-11 (×16): qty 2

## 2017-03-11 MED ORDER — PROPRANOLOL HCL 40 MG PO TABS
40.0000 mg | ORAL_TABLET | Freq: Three times a day (TID) | ORAL | Status: DC
  Administered 2017-03-11 – 2017-03-15 (×11): 40 mg via ORAL
  Filled 2017-03-11 (×13): qty 1

## 2017-03-11 NOTE — Consult Note (Signed)
[facility]    Date of Admission:  03/10/2017   Total days of antibiotics 1        Azithromycin Day 1        Ceftriaxone Day 1        Cefepime Day 1       Reason for Consult: Pseudomonas / E. Coli Bacteremia    [facility]   Assessment: [age] y/o male with E.Coli and Pseudomonas bacteremia with initial altered mental status and fevers and now pancytopenia likely related to sepsis.  Appears to be improving with current medication regimen and is back to baseline mental status.   Plan: 1. Agree with Cefepime for Psuedomonas and E. Coli bacteremia.  2. Await echocardiogram results to determine risk for endocarditis.  3. Obtain Hepatiis C antibody for routine screening.    Principal Problem:   Altered mental status Active Problems:   Lactic acidosis   Scheduled Meds: . gabapentin  100 mg Oral QHS  . insulin aspart  0-15 Units Subcutaneous TID WC  . insulin aspart  0-5 Units Subcutaneous QHS  . potassium chloride  40 mEq Oral Daily  . pravastatin  20 mg Oral q1800   Continuous Infusions: . ceFEPime (MAXIPIME) IV Stopped (03/11/17 0710)   PRN Meds:.acetaminophen **OR** acetaminophen, ALPRAZolam, methocarbamol  HPI: Shane Peterson is a 71 y.o. male with PMH of DM, HLD, HTN, IHD, and PVC's who was evaluated in the ED with the chief complaint of fevers and chills. Started when driving home he noted tremors and chills. EMS noted a temporal temperature of 100.2. Experience spit up/vomit once. Expressed low back and chest pain with the low back pain being chronic and the chest pain worsened when coughing. No urinary symptoms, headache, or neck pain. In the ED he was alert and disoriented. A murmur was present on physical exam. WBC count was 2.6 and platelets of 117. Urine had small amount of hematuria and protein with no leukocytosis or nitrites. EKG was sinus rhythm. Chest x-ray with no  acute cardiopulmonary disease. He was started on ceftriaxone for UTI with additional concern for URI versus influenza with rapid influenza being negative. Also received azithromycin for empiric coverage. T-max of 103.5 on 10/22. Blood cultures were positive for E. Coli and P. Aeruginosa. He is now currently on Cefepime.    Review of Systems: Review of Systems  Constitutional: Negative for chills, fever and weight loss.  Respiratory: Negative for cough, hemoptysis, sputum production, shortness of breath and wheezing.   Cardiovascular: Negative for chest pain, palpitations, orthopnea, claudication, leg swelling and PND.  Skin: Negative for rash.  Neurological: Positive for tremors and headaches. Negative for weakness.    Past Medical History:  Diagnosis Date  . Coronary artery disease   . Family history of adverse reaction to anesthesia    "daughter get sick" (03/10/2017)  . GERD (gastroesophageal reflux disease)   . HLD (hyperlipidemia)   . HTN (hypertension)   . IHD (ischemic heart disease)   . Palpitations   . PONV (postoperative nausea and vomiting)   . PVC's (premature ventricular contractions)   . Type II diabetes mellitus (HCC)     Social History  Substance Use Topics  . Smoking status: Never Smoker  . Smokeless tobacco: Never Used  . Alcohol use No    Family History  Problem Relation Age of Onset  . Heart attack Unknown   . Atrial  fibrillation Unknown   . Hypertension Unknown   . Cerebral palsy Unknown    Allergies  Allergen Reactions  . Codeine Other (See Comments)    hallucinations  . Sulfonamide Derivatives Swelling    Throat swelling    OBJECTIVE: Blood pressure (!) 127/43, pulse 84, temperature 99.7 F (37.6 C), temperature source Oral, resp. rate 19, height 5\' 8"  (1.727 m), SpO2 96 %.  Physical Exam  Constitutional: He is well-developed, well-nourished, and in no distress. No distress.  Seated in chair.   Eyes: Pupils are equal, round, and reactive  to light. Conjunctivae and EOM are normal.  Cardiovascular: Exam reveals no gallop and no friction rub.   Murmur heard. Pulmonary/Chest: Effort normal and breath sounds normal. No respiratory distress. He has no wheezes. He has no rales. He exhibits no tenderness.  Skin: Skin is warm and dry. No rash noted. No pallor.  Psychiatric: Mood, memory, affect and judgment normal.    Lab Results Lab Results  Component Value Date   WBC 3.4 (L) 03/11/2017   HGB 12.7 (L) 03/11/2017   HCT 38.0 (L) 03/11/2017   MCV 87.2 03/11/2017   PLT 89 (L) 03/11/2017    Lab Results  Component Value Date   CREATININE 1.00 03/11/2017   BUN 13 03/11/2017   NA 133 (L) 03/11/2017   K 3.3 (L) 03/11/2017   CL 102 03/11/2017   CO2 20 (L) 03/11/2017    Lab Results  Component Value Date   ALT 53 03/10/2017   AST 39 03/10/2017   ALKPHOS 108 03/10/2017   BILITOT 1.2 03/10/2017     Microbiology: Recent Results (from the past 240 hour(s))  Urine culture     Status: None   Collection Time: 03/10/17  9:59 AM  Result Value Ref Range Status   Specimen Description URINE, CLEAN CATCH  Final   Special Requests NONE  Final   Culture NO GROWTH  Final   Report Status 03/11/2017 FINAL  Final  Blood Culture (routine x 2)     Status: None (Preliminary result)   Collection Time: 03/10/17 10:09 AM  Result Value Ref Range Status   Specimen Description BLOOD LEFT FOREARM  Final   Special Requests   Final    BOTTLES DRAWN AEROBIC AND ANAEROBIC Blood Culture results may not be optimal due to an inadequate volume of blood received in culture bottles   Culture  Setup Time   Final    GRAM NEGATIVE RODS IN BOTH AEROBIC AND ANAEROBIC BOTTLES CRITICAL RESULT CALLED TO, READ BACK BY AND VERIFIED WITH: JAMES LEDFORD [facility] 05/11/17 0055 BEAMJ    Culture GRAM NEGATIVE RODS  Final   Report Status PENDING  Incomplete  Blood Culture ID Panel (Reflexed)     Status: Abnormal   Collection Time: 03/10/17 10:09 AM  Result Value Ref  Range Status   Enterococcus species NOT DETECTED NOT DETECTED Final   Listeria monocytogenes NOT DETECTED NOT DETECTED Final   Staphylococcus species NOT DETECTED NOT DETECTED Final   Staphylococcus aureus NOT DETECTED NOT DETECTED Final   Streptococcus species NOT DETECTED NOT DETECTED Final   Streptococcus agalactiae NOT DETECTED NOT DETECTED Final   Streptococcus pneumoniae NOT DETECTED NOT DETECTED Final   Streptococcus pyogenes NOT DETECTED NOT DETECTED Final   Acinetobacter baumannii NOT DETECTED NOT DETECTED Final   Enterobacteriaceae species DETECTED (A) NOT DETECTED Final    Comment: Enterobacteriaceae represent a large family of gram-negative bacteria, not a single organism. CRITICAL RESULT CALLED TO, READ BACK BY AND  VERIFIED WITH: JAMES LEDFORD [facility] 05/11/17 0055 BEAMJ    Enterobacter cloacae complex NOT DETECTED NOT DETECTED Final   Escherichia coli DETECTED (A) NOT DETECTED Final    Comment: CRITICAL RESULT CALLED TO, READ BACK BY AND VERIFIED WITH: JAMES LEDFORD [facility] 05/11/17 0055 BEAMJ    Klebsiella oxytoca NOT DETECTED NOT DETECTED Final   Klebsiella pneumoniae NOT DETECTED NOT DETECTED Final   Proteus species NOT DETECTED NOT DETECTED Final   Serratia marcescens NOT DETECTED NOT DETECTED Final   Carbapenem resistance NOT DETECTED NOT DETECTED Final   Haemophilus influenzae NOT DETECTED NOT DETECTED Final   Neisseria meningitidis NOT DETECTED NOT DETECTED Final   Pseudomonas aeruginosa DETECTED (A) NOT DETECTED Final    Comment: CRITICAL RESULT CALLED TO, READ BACK BY AND VERIFIED WITH: JAMES LEFFORD [facility] 05/11/17 0055 BEAMJ    Candida albicans NOT DETECTED NOT DETECTED Final   Candida glabrata NOT DETECTED NOT DETECTED Final   Candida krusei NOT DETECTED NOT DETECTED Final   Candida parapsilosis NOT DETECTED NOT DETECTED Final   Candida tropicalis NOT DETECTED NOT DETECTED Final    Marcos Eke, NP [facility], [facility] [phone] Pager [phone] Cell   03/11/2017, 10:16 AM

## 2017-03-11 NOTE — Progress Notes (Signed)
Called by nursing that the patient converted to an irregular rhythm on tele at 1623. On review of tele irregular rhythm but rate controlled with HR in the 70s. BP stable. Patient denies chest pain, but endorses flutter sensation, which he states is chronic and takes medicine for. No history of afib/flutter that he knows of, but [facility]. 12 lead EKG obtained, irregular rhythm consistent with afib, rate controlled on propranolol.    Shane MireSam Kattie Santoyo, MD Internal Medicine PGY1 Pager # [phone]

## 2017-03-11 NOTE — Progress Notes (Signed)
Pharmacy Antibiotic Note  Shane Peterson is a 71 y.o. male admitted on 03/10/2017 with bacteremia.  Pharmacy has been consulted for Cefepime dosing. BCID detected E Coli and Pseudomonas. WBC 2.6. Renal function ok.   Plan: Cefepime 2g IV q8h Trend WBC, temp, renal function  F/U culture   Temp (24hrs), Avg:100.4 F (38 C), Min:98.2 F (36.8 C), Max:103.5 F (39.7 C)   Recent Labs Lab 03/10/17 0940 03/10/17 1020 03/10/17 1154  WBC 2.6*  --   --   CREATININE 0.93  --   --   LATICACIDVEN  --  3.08* 1.89    CrCl cannot be calculated (Unknown ideal weight.).    Allergies  Allergen Reactions  . Codeine Other (See Comments)    hallucinations  . Sulfonamide Derivatives Swelling    Throat swelling    Abran DukeLedford, Andra Matsuo 03/11/2017 12:54 AM

## 2017-03-11 NOTE — Progress Notes (Signed)
Patient converted to Afib today 1623 RN was aware when telemetry called to notify RN of pause 2.13 sec at 1832. Once Rn was reviewing strips today. Also patient had a few runs of SVT that RN was not made aware of. MD was given updates on all this information and is at beside to evaluate patient.  EKG was request and performed.

## 2017-03-11 NOTE — Progress Notes (Signed)
   Subjective: Doing well this AM. He is back to his baseline. States that he has not had any more loose bowel movement. Did have 3 the day prior. Has not noticed any skin infections or open wounds on his feet. Denies dysuria, difficulty initiating or weak stream, or other symptoms consistent with prostate etiology. Surgical history pertinent for cholecystectomy. Denies abdominal pain. Discussed the results of his blood work and that we will need to do a few more tests. He agrees with the plan. All questions and concerns addressed.   Objective: Vital signs in last 24 hours: Vitals:   03/10/17 1714 03/10/17 2037 03/10/17 2151 03/11/17 0521  BP: (!) 99/50  (!) 151/58 (!) 127/43  Pulse: 66  88 84  Resp: 16  18 19   Temp:  99 F (37.2 C) 99.1 F (37.3 C) 99.7 F (37.6 C)  TempSrc:  Oral Oral Oral  SpO2: 96%  92% 96%   General: Obese male resting comfortably in bed  Pulm: Good air movement, no wheezing or crackles  CV: RRR, systolic murmur  Abdomen: Active bowel sounds, soft, no tenderness to palpation  Skin: Feet have no open wounds or breaks in skin  Bedside Ultrasound: Quick view of the mitral and aortic valve do not appear to have any vegetations. Liver appears grossly normal with no gallbladder. Spleen grossly normal. Kidneys do not appear enlarged. No pleural effusions noted with A lines.   Assessment/Plan:  1. Severe sepsis 2/2 Bacteremia - Clinically improved since admission  - Afebrile since moving from the ED - BP improving over the interval  - Influenza swabs negative for A & B - Lactic acid trended back to normal  - Bio[employer] identified E. Coli and Pseudomonas, started on Cefepime overnight  - Unsure of possible source, CXR and UA showing no signs of infection. Skin exam unremarkable. Patient denies diarrhea, symptoms consistent with prostate pathology, recent or new puncture wounds. - Obtain Echocardiogram to rule out subacute endocarditis, although gram negative bacteria are  less likely to cause endocarditis  - Will consult ID and repeat BC  2. Type II MI - EKG x 2 unchanged from prior  - Troponin trend: 0.00 -> 0.18 -> 0.19 -> 0.12 - Demand ischemia in the setting of Sepsis   3. Aortic Stenosis  - Patient states that this is a new murmur - Asymptomatic, will obtain echocardiogram in the setting of bacteremia   4. Pancytopenia  - WBC 3.4, Hgb 12.7, and Plt 84 this AM  - No signs of hemolysis or renal dysfunction  - No increase in RDW  - In the setting of bacterial sepsis, will continue to monitor   5. Diabetes Mellitus  - Sugars well controlled since admission  - SSI  6. Hypertension  - BP has improved over the interval and is now borderline HTN - Will resume home medications this PM  7. 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 Electrolyte derangements  - Hyponatremia: Monitor, glucose 144 - Hypokalemia: Replace with KCl 40 mEq PO   Dispo: Anticipated discharge in approximately >1 day(s).   Shane Peterson, Alize Borrayo, MD 03/11/2017, 5:57 AM My Pager: [phone]

## 2017-03-11 NOTE — Progress Notes (Signed)
  Echocardiogram 2D Echocardiogram has been performed.  Delcie RochENNINGTON, Raimundo Corbit 03/11/2017, 2:59 PM

## 2017-03-11 NOTE — Progress Notes (Signed)
PHARMACY - PHYSICIAN COMMUNICATION CRITICAL VALUE ALERT - BLOOD CULTURE IDENTIFICATION (BCID)  Results for orders placed or performed during the hospital encounter of 03/10/17  Blood Culture ID Panel (Reflexed) (Collected: 03/10/2017 10:09 AM)  Result Value Ref Range   Enterococcus species NOT DETECTED NOT DETECTED   Listeria monocytogenes NOT DETECTED NOT DETECTED   Staphylococcus species NOT DETECTED NOT DETECTED   Staphylococcus aureus NOT DETECTED NOT DETECTED   Streptococcus species NOT DETECTED NOT DETECTED   Streptococcus agalactiae NOT DETECTED NOT DETECTED   Streptococcus pneumoniae NOT DETECTED NOT DETECTED   Streptococcus pyogenes NOT DETECTED NOT DETECTED   Acinetobacter baumannii NOT DETECTED NOT DETECTED   Enterobacteriaceae species DETECTED (A) NOT DETECTED   Enterobacter cloacae complex NOT DETECTED NOT DETECTED   Escherichia coli DETECTED (A) NOT DETECTED   Klebsiella oxytoca NOT DETECTED NOT DETECTED   Klebsiella pneumoniae NOT DETECTED NOT DETECTED   Proteus species NOT DETECTED NOT DETECTED   Serratia marcescens NOT DETECTED NOT DETECTED   Carbapenem resistance NOT DETECTED NOT DETECTED   Haemophilus influenzae NOT DETECTED NOT DETECTED   Neisseria meningitidis NOT DETECTED NOT DETECTED   Pseudomonas aeruginosa DETECTED (A) NOT DETECTED   Candida albicans NOT DETECTED NOT DETECTED   Candida glabrata NOT DETECTED NOT DETECTED   Candida krusei NOT DETECTED NOT DETECTED   Candida parapsilosis NOT DETECTED NOT DETECTED   Candida tropicalis NOT DETECTED NOT DETECTED    Name of physician (or Provider) Contacted: IMTS  Changes to prescribed antibiotics required: Start Cefepime  Shane Peterson, [place] Delauder 03/11/2017  1:02 AM

## 2017-03-12 DIAGNOSIS — E1151 Type 2 diabetes mellitus with diabetic peripheral angiopathy without gangrene: Secondary | ICD-10-CM

## 2017-03-12 DIAGNOSIS — Z7982 Long term (current) use of aspirin: Secondary | ICD-10-CM

## 2017-03-12 DIAGNOSIS — I4891 Unspecified atrial fibrillation: Secondary | ICD-10-CM

## 2017-03-12 LAB — BASIC METABOLIC PANEL
Anion gap: 13 (ref 5–15)
BUN: 13 mg/dL (ref 6–20)
CHLORIDE: 100 mmol/L — AB (ref 101–111)
CO2: 19 mmol/L — AB (ref 22–32)
CREATININE: 0.92 mg/dL (ref 0.61–1.24)
Calcium: 8.8 mg/dL — ABNORMAL LOW (ref 8.9–10.3)
GFR calc Af Amer: >60 mL/min (ref >60)
GFR calc non Af Amer: >60 mL/min (ref >60)
GLUCOSE: 136 mg/dL — AB (ref 65–99)
POTASSIUM: 4.4 mmol/L (ref 3.5–5.1)
Sodium: 132 mmol/L — ABNORMAL LOW (ref 135–145)

## 2017-03-12 LAB — CBC
HCT: 39.8 % (ref 39.0–52.0)
Hemoglobin: 13.3 g/dL (ref 13.0–17.0)
MCH: 28.9 pg (ref 26.0–34.0)
MCHC: 33.4 g/dL (ref 30.0–36.0)
MCV: 86.3 fL (ref 78.0–100.0)
Platelets: 83 K/uL — ABNORMAL LOW (ref 150–400)
RBC: 4.61 MIL/uL (ref 4.22–5.81)
RDW: 13.7 % (ref 11.5–15.5)
WBC: 3.2 K/uL — ABNORMAL LOW (ref 4.0–10.5)

## 2017-03-12 LAB — GLUCOSE, CAPILLARY
GLUCOSE-CAPILLARY: 158 mg/dL — AB (ref 65–99)
Glucose-Capillary: 177 mg/dL — ABNORMAL HIGH (ref 65–99)
Glucose-Capillary: 181 mg/dL — ABNORMAL HIGH (ref 65–99)
Glucose-Capillary: 231 mg/dL — ABNORMAL HIGH (ref 65–99)

## 2017-03-12 LAB — APTT: APTT: 35 s (ref 24–36)

## 2017-03-12 LAB — PROTIME-INR
INR: 1.15
Prothrombin Time: 14.6 seconds (ref 11.4–15.2)

## 2017-03-12 MED ORDER — APIXABAN 5 MG PO TABS
5.0000 mg | ORAL_TABLET | Freq: Two times a day (BID) | ORAL | Status: DC
  Administered 2017-03-12 – 2017-03-15 (×6): 5 mg via ORAL
  Filled 2017-03-12 (×6): qty 1

## 2017-03-12 MED ORDER — SALINE SPRAY 0.65 % NA SOLN
1.0000 | NASAL | Status: DC | PRN
  Filled 2017-03-12: qty 44

## 2017-03-12 MED ORDER — CALCIUM CARBONATE ANTACID 500 MG PO CHEW
1.0000 | CHEWABLE_TABLET | Freq: Three times a day (TID) | ORAL | Status: DC | PRN
  Administered 2017-03-12: 200 mg via ORAL
  Filled 2017-03-12: qty 1

## 2017-03-12 MED ORDER — LORATADINE 10 MG PO TABS
10.0000 mg | ORAL_TABLET | Freq: Every day | ORAL | Status: DC
  Administered 2017-03-12 – 2017-03-14 (×3): 10 mg via ORAL
  Filled 2017-03-12 (×3): qty 1

## 2017-03-12 NOTE — Progress Notes (Signed)
Notified MD of 2.1 sec pause. Vital signs stable. Pt in no distress. Denies chest pain or shortness of breath. Will cont to monitor.

## 2017-03-12 NOTE — Progress Notes (Signed)
ANTICOAGULATION CONSULT NOTE - Initial Consult  Pharmacy Consult for Eliquis Indication: atrial fibrillation  Allergies  Allergen Reactions  . Codeine Other (See Comments)    hallucinations  . Sulfonamide Derivatives Swelling    Throat swelling    Patient Measurements: Height: 5\' 8"  (172.7 cm) (patient reports) IBW/kg (Calculated) : 68.4  Vital Signs: Temp: 97.8 F (36.6 C) (10/24 1426) Temp Source: Oral (10/24 1426) BP: 142/80 (10/24 1540) Pulse Rate: 86 (10/24 1540)  Labs:  Recent Labs  03/10/17 0940 03/10/17 1557 03/10/17 2104 03/11/17 0344 03/12/17 0600 03/12/17 1110  HGB 15.1  --   --  12.7* 13.3  --   HCT 44.6  --   --  38.0* 39.8  --   PLT 117*  --   --  89* 83*  --   APTT  --   --   --   --   --  35  LABPROT  --   --   --   --   --  14.6  INR  --   --   --   --   --  1.15  CREATININE 0.93  --   --  1.00 0.92  --   TROPONINI  --  0.18* 0.19* 0.12*  --   --     CrCl cannot be calculated (Unknown ideal weight.).   Medical History: Past Medical History:  Diagnosis Date  . Coronary artery disease   . Family history of adverse reaction to anesthesia    "daughter get sick" (03/10/2017)  . GERD (gastroesophageal reflux disease)   . HLD (hyperlipidemia)   . HTN (hypertension)   . IHD (ischemic heart disease)   . Palpitations   . PONV (postoperative nausea and vomiting)   . PVC's (premature ventricular contractions)   . Type II diabetes mellitus (HCC)    Assessment: 70yom with new onset afib to begin eliquis. He does not meet criteria for dose reduction. Hgb stable, platelets trending down 117 on admit to 83 today - will monitor closely.  Goal of Therapy:  Monitor platelets by anticoagulation protocol: Yes   Plan:  1) Eliquis 5mg  po bid 2) Case management consult ordered 3) Will provide education  Fredrik RiggerMarkle, Andalyn Heckstall Sue 03/12/2017,6:10 PM

## 2017-03-12 NOTE — Progress Notes (Signed)
[facility]  Date of Admission:  03/10/2017   Total days of antibiotics 2        Cefepime Day 2          ASSESSMENT/PLAN: 71 y/o male with gram negative bacteremia with source unknown. TTE with no evidence of vegetation and no other symptoms. This is the first time he has had GN bacteremia. For now do not recommend further testing, and would treat for 2 wk since last positive culture. If he has recurrent of GN bacteremia not associated with urinary tract infection, Could consider CTA to rule out mycotic aneurysm.   Has remained afebrile and blood cultures to date having no growth appearing consistent with simple gram negative bacteremia.   - Continue course of Cefepime and will likely require 2 weeks of therapy. Await sensitivities on pseudomonas isolate. If it is sensitive to FQ, can consider finishing out course of levofloxacin. If it is resistant to FQ then will need picc line to finish out course of cefepime   Will sign off. Call if questions  Principal Problem:   Bacteremia Active Problems:   Lactic acidosis   Altered mental status   Sepsis (HCC)   Scheduled Meds: . gabapentin  100 mg Oral QHS  . insulin aspart  0-15 Units Subcutaneous TID WC  . insulin aspart  0-5 Units Subcutaneous QHS  . pravastatin  20 mg Oral q1800  . propranolol  40 mg Oral TID   Continuous Infusions: . ceFEPime (MAXIPIME) IV Stopped (03/12/17 0805)   PRN Meds:.acetaminophen **OR** acetaminophen, ALPRAZolam, methocarbamol   SUBJECTIVE: Feeling back to baseline with no fevers or chills overnight.   Review of Systems: Review of Systems  Constitutional: Negative for chills and fever.  Respiratory: Negative for cough and wheezing.   Cardiovascular: Negative for chest pain and leg swelling.  Genitourinary: Negative for dysuria, flank pain, frequency, hematuria and urgency.  Skin: Negative for rash.  Neurological: Negative for dizziness, seizures, weakness and  headaches.    Allergies  Allergen Reactions  . Codeine Other (See Comments)    hallucinations  . Sulfonamide Derivatives Swelling    Throat swelling    OBJECTIVE: Vitals:   03/11/17 1100 03/11/17 1455 03/11/17 2009 03/12/17 0448  BP:  (!) 159/56 (!) 117/52 107/70  Pulse:  82 75 78  Resp:  19 19   Temp: 98.4 F (36.9 C) (!) 100.7 F (38.2 C) 98.3 F (36.8 C) 98 F (36.7 C)  TempSrc:  Oral Oral Oral  SpO2:  95% 98% 97%  Height:       There is no height or weight on file to calculate BMI.  Physical Exam  Constitutional: He is oriented to person, place, and time and well-developed, well-nourished, and in no distress. No distress.  Cardiovascular: Normal rate, normal heart sounds and intact distal pulses.  Exam reveals no gallop and no friction rub.   No murmur heard. Pulmonary/Chest: Effort normal and breath sounds normal. No respiratory distress. He has no wheezes. He has no rales. He exhibits no tenderness.  Abdominal: Soft. Bowel sounds are normal. He exhibits no distension and no mass. There is no tenderness. There is no rebound and no guarding.  Neurological: He is alert and oriented to person, place, and time.  Skin: Skin is warm. No rash noted. No pallor.  Psychiatric: Mood, memory, affect and judgment normal.    Lab Results Lab Results  Component Value Date   WBC 3.2 (L) 03/12/2017   HGB  13.3 03/12/2017   HCT 39.8 03/12/2017   MCV 86.3 03/12/2017   PLT 83 (L) 03/12/2017    Lab Results  Component Value Date   CREATININE 0.92 03/12/2017   BUN 13 03/12/2017   NA 132 (L) 03/12/2017   K 4.4 03/12/2017   CL 100 (L) 03/12/2017   CO2 19 (L) 03/12/2017    Lab Results  Component Value Date   ALT 53 03/10/2017   AST 39 03/10/2017   ALKPHOS 108 03/10/2017   BILITOT 1.2 03/10/2017     Microbiology: Recent Results (from the past 240 hour(s))  Urine culture     Status: None   Collection Time: 03/10/17  9:59 AM  Result Value Ref Range Status   Specimen  Description URINE, CLEAN CATCH  Final   Special Requests NONE  Final   Culture NO GROWTH  Final   Report Status 03/11/2017 FINAL  Final  Blood Culture (routine x 2)     Status: Abnormal (Preliminary result)   Collection Time: 03/10/17 10:09 AM  Result Value Ref Range Status   Specimen Description BLOOD LEFT FOREARM  Final   Special Requests   Final    BOTTLES DRAWN AEROBIC AND ANAEROBIC Blood Culture results may not be optimal due to an inadequate volume of blood received in culture bottles   Culture  Setup Time   Final    GRAM NEGATIVE RODS IN BOTH AEROBIC AND ANAEROBIC BOTTLES CRITICAL RESULT CALLED TO, READ BACK BY AND VERIFIED WITH: JAMES LEDFORD [facility] 05/11/17 0055 BEAMJ    Culture (A)  Final    ESCHERICHIA COLI PSEUDOMONAS AERUGINOSA SUSCEPTIBILITIES TO FOLLOW    Report Status PENDING  Incomplete   Organism ID, Bacteria ESCHERICHIA COLI  Final      Susceptibility   Escherichia coli - MIC*    AMPICILLIN <=2 SENSITIVE Sensitive     CEFAZOLIN <=4 SENSITIVE Sensitive     CEFEPIME <=1 SENSITIVE Sensitive     CEFTAZIDIME <=1 SENSITIVE Sensitive     CEFTRIAXONE <=1 SENSITIVE Sensitive     CIPROFLOXACIN <=0.25 SENSITIVE Sensitive     GENTAMICIN <=1 SENSITIVE Sensitive     IMIPENEM <=0.25 SENSITIVE Sensitive     TRIMETH/SULFA <=20 SENSITIVE Sensitive     AMPICILLIN/SULBACTAM <=2 SENSITIVE Sensitive     PIP/TAZO <=4 SENSITIVE Sensitive     Extended ESBL NEGATIVE Sensitive     * ESCHERICHIA COLI  Blood Culture ID Panel (Reflexed)     Status: Abnormal   Collection Time: 03/10/17 10:09 AM  Result Value Ref Range Status   Enterococcus species NOT DETECTED NOT DETECTED Final   Listeria monocytogenes NOT DETECTED NOT DETECTED Final   Staphylococcus species NOT DETECTED NOT DETECTED Final   Staphylococcus aureus NOT DETECTED NOT DETECTED Final   Streptococcus species NOT DETECTED NOT DETECTED Final   Streptococcus agalactiae NOT DETECTED NOT DETECTED Final   Streptococcus  pneumoniae NOT DETECTED NOT DETECTED Final   Streptococcus pyogenes NOT DETECTED NOT DETECTED Final   Acinetobacter baumannii NOT DETECTED NOT DETECTED Final   Enterobacteriaceae species DETECTED (A) NOT DETECTED Final    Comment: Enterobacteriaceae represent a large family of gram-negative bacteria, not a single organism. CRITICAL RESULT CALLED TO, READ BACK BY AND VERIFIED WITH: JAMES LEDFORD [facility] 05/11/17 0055 BEAMJ    Enterobacter cloacae complex NOT DETECTED NOT DETECTED Final   Escherichia coli DETECTED (A) NOT DETECTED Final    Comment: CRITICAL RESULT CALLED TO, READ BACK BY AND VERIFIED WITH: [facility] 05/11/17 0055 BEAMJ  Klebsiella oxytoca NOT DETECTED NOT DETECTED Final   Klebsiella pneumoniae NOT DETECTED NOT DETECTED Final   Proteus species NOT DETECTED NOT DETECTED Final   Serratia marcescens NOT DETECTED NOT DETECTED Final   Carbapenem resistance NOT DETECTED NOT DETECTED Final   Haemophilus influenzae NOT DETECTED NOT DETECTED Final   Neisseria meningitidis NOT DETECTED NOT DETECTED Final   Pseudomonas aeruginosa DETECTED (A) NOT DETECTED Final    Comment: CRITICAL RESULT CALLED TO, READ BACK BY AND VERIFIED WITH: JAMES LEFFORD [facility] 05/11/17 0055 BEAMJ    Candida albicans NOT DETECTED NOT DETECTED Final   Candida glabrata NOT DETECTED NOT DETECTED Final   Candida krusei NOT DETECTED NOT DETECTED Final   Candida parapsilosis NOT DETECTED NOT DETECTED Final   Candida tropicalis NOT DETECTED NOT DETECTED Final  Blood Culture (routine x 2)     Status: None (Preliminary result)   Collection Time: 03/10/17 10:55 AM  Result Value Ref Range Status   Specimen Description BLOOD RIGHT ANTECUBITAL  Final   Special Requests   Final    BOTTLES DRAWN AEROBIC AND ANAEROBIC Blood Culture adequate volume   Culture NO GROWTH 2 DAYS  Final   Report Status PENDING  Incomplete  Culture, blood (routine x 2)     Status: None (Preliminary result)   Collection Time:  03/11/17  9:15 AM  Result Value Ref Range Status   Specimen Description BLOOD RIGHT HAND  Final   Special Requests IN PEDIATRIC BOTTLE Blood Culture adequate volume  Final   Culture NO GROWTH 1 DAY  Final   Report Status PENDING  Incomplete  Culture, blood (routine x 2)     Status: None (Preliminary result)   Collection Time: 03/11/17  9:15 AM  Result Value Ref Range Status   Specimen Description BLOOD LEFT HAND  Final   Special Requests IN PEDIATRIC BOTTLE Blood Culture adequate volume  Final   Culture NO GROWTH 1 DAY  Final   Report Status PENDING  Incomplete   Marcos Eke, NP [facility] [phone] Pager [phone] Cell  03/12/2017, 11:24 AM

## 2017-03-12 NOTE — Progress Notes (Signed)
   Subjective: Febrile overnight and converted to A-fib. States that he has never been told he has A-fib. [facility] Will touch base with her. No other complaints this AM, feels that he is back to his baseline. Does express that his dog scratches him at times and wondering if that is how he got the infection. Last colonoscopy was 10 years ago, not willing to get another colonoscopy. All questions and concerns addressed.   Objective: Vital signs in last 24 hours: Vitals:   03/11/17 1100 03/11/17 1455 03/11/17 2009 03/12/17 0448  BP:  (!) 159/56 (!) 117/52 107/70  Pulse:  82 75 78  Resp:  19 19   Temp: 98.4 F (36.9 C) (!) 100.7 F (38.2 C) 98.3 F (36.8 C) 98 F (36.7 C)  TempSrc:  Oral Oral Oral  SpO2:  95% 98% 97%  Height:       General: Obese male resting comfortably in bed  Pulm: Good air movement, no wheezing or crackles  CV: RRR, systolic murmur  Abdomen: Active bowel sounds, soft, no tenderness to palpation  Skin: Feet have no open wounds or breaks in skin  Assessment/Plan:  1. Severe sepsis 2/2 Bacteremia - Continues to be clinically stable  - Bio[employer] identified E. Coli and Pseudomonas, blood cultures showing no growth at 24 hours - Unclear source at this point  - Transthoracic echocardiogram done yesterday. No mention of valvular abnormalities or vegetations. Do not think a TEE would be of clinical benefit given low likelihood of endocarditis. - Repeat BC pending   - ID onboard, agree with Cefepime. Can switch 2 week course of Cipro on discharge but will wait to see what the Pseudomonas susceptibilities are   2. Atrial Fibrillation, new diagnosis  - Telemetry consistent with A-Fib  - Currently rate controlled with Propanolol  - [facility] This is a new diagnosis.  - On aspirin as an outpatient but given his history of DM and PAD/CAD will need anticoagulation  - Coagulation panel prior to starting Eliquis  (print prescription with lab work so [facility]). - Echocardiogram illustrating normal sized atriums   3. Aortic Stenosis  - Patient states that this is a new murmur - Asymptomatic, no mention of AS on echocardiogram   4. Pancytopenia  - WBC 3.4, Hgb 13.3, and Plt 84 this AM  - In the setting of bacterial sepsis, will continue to monitor   5. Diabetes Mellitus  - Sugars well controlled since admission  - SSI  6. Hypertension  - BP has improved over the interval and is now borderline HTN  7. 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 Electrolyte derangements  - Monitor and replete as needed   Type II MI. [place]  Dispo: Anticipated discharge in approximately 1 day(s).   Levora DredgeHelberg, Freeman Borba, MD 03/12/2017, 6:10 AM My Pager: [phone]

## 2017-03-12 NOTE — Progress Notes (Signed)
Subjective: Pt spiked a fever of 100.7 yesterday afternoon but has remained afebrile since then. Patient converted to new onset atrial fibulation yesterday afternoon but has been rate controlled on propanolol.  All other vital signs stable. Pt continues to improve clinically, able to ambulate back and forth between bed and chair. Daughter noted that pt lives with dog who often scratches him as a possible infectious etiology but no current signs of recent scratches. Denies any chest pain, abd pain, headache, malaise, cough, nausea, vomiting, diarrhea, constipation, or urinary symptoms. Pt denied any further questions or concerns.  Objective: Vital signs in last 24 hours: Vitals:   03/11/17 1100 03/11/17 1455 03/11/17 2009 03/12/17 0448  BP:  (!) 159/56 (!) 117/52 107/70  Pulse:  82 75 78  Resp:  19 19   Temp: 98.4 F (36.9 C) (!) 100.7 F (38.2 C) 98.3 F (36.8 C) 98 F (36.7 C)  TempSrc:  Oral Oral Oral  SpO2:  95% 98% 97%  Height:        Physical Exam  Constitutional: He is oriented to person, place, and time and well-developed, well-nourished, and in no distress.  sitting comfortably in chair  HENT:  Head: Normocephalic and atraumatic.  Neck: Normal range of motion. Neck supple. No JVD present. No thyromegaly present.  Cardiovascular: Normal rate, regular rhythm, normal heart sounds and intact distal pulses.  Exam reveals no gallop and no friction rub.   No murmur heard. Pulmonary/Chest: Effort normal and breath sounds normal. No respiratory distress. He has no wheezes. He has no rales. He exhibits no tenderness.  Abdominal: Soft. Bowel sounds are normal. He exhibits no distension. There is no tenderness.  Musculoskeletal: Normal range of motion. He exhibits no edema or tenderness.  Neurological: He is alert and oriented to person, place, and time. He has normal strength. No cranial nerve deficit. He exhibits normal muscle tone.  Skin: Skin is warm and dry. No rash noted. No  erythema.  No open wounds or breaks in skin from feet to knees bilaterally, small superficial scratch located on the medial flexor surface of right elbow.     Micro Results: Recent Results (from the past 240 hour(s))  Urine culture     Status: None   Collection Time: 03/10/17  9:59 AM  Result Value Ref Range Status   Specimen Description URINE, CLEAN CATCH  Final   Special Requests NONE  Final   Culture NO GROWTH  Final   Report Status 03/11/2017 FINAL  Final  Blood Culture (routine x 2)     Status: Abnormal (Preliminary result)   Collection Time: 03/10/17 10:09 AM  Result Value Ref Range Status   Specimen Description BLOOD LEFT FOREARM  Final   Special Requests   Final    BOTTLES DRAWN AEROBIC AND ANAEROBIC Blood Culture results may not be optimal due to an inadequate volume of blood received in culture bottles   Culture  Setup Time   Final    GRAM NEGATIVE RODS IN BOTH AEROBIC AND ANAEROBIC BOTTLES CRITICAL RESULT CALLED TO, READ BACK BY AND VERIFIED WITH: JAMES LEDFORD [facility] 05/11/17 0055 BEAMJ    Culture (A)  Final    ESCHERICHIA COLI PSEUDOMONAS AERUGINOSA SUSCEPTIBILITIES TO FOLLOW    Report Status PENDING  Incomplete   Organism ID, Bacteria ESCHERICHIA COLI  Final      Susceptibility   Escherichia coli - MIC*    AMPICILLIN <=2 SENSITIVE Sensitive     CEFAZOLIN <=4 SENSITIVE Sensitive     CEFEPIME <=  1 SENSITIVE Sensitive     CEFTAZIDIME <=1 SENSITIVE Sensitive     CEFTRIAXONE <=1 SENSITIVE Sensitive     CIPROFLOXACIN <=0.25 SENSITIVE Sensitive     GENTAMICIN <=1 SENSITIVE Sensitive     IMIPENEM <=0.25 SENSITIVE Sensitive     TRIMETH/SULFA <=20 SENSITIVE Sensitive     AMPICILLIN/SULBACTAM <=2 SENSITIVE Sensitive     PIP/TAZO <=4 SENSITIVE Sensitive     Extended ESBL NEGATIVE Sensitive     * ESCHERICHIA COLI  Blood Culture ID Panel (Reflexed)     Status: Abnormal   Collection Time: 03/10/17 10:09 AM  Result Value Ref Range Status   Enterococcus species NOT  DETECTED NOT DETECTED Final   Listeria monocytogenes NOT DETECTED NOT DETECTED Final   Staphylococcus species NOT DETECTED NOT DETECTED Final   Staphylococcus aureus NOT DETECTED NOT DETECTED Final   Streptococcus species NOT DETECTED NOT DETECTED Final   Streptococcus agalactiae NOT DETECTED NOT DETECTED Final   Streptococcus pneumoniae NOT DETECTED NOT DETECTED Final   Streptococcus pyogenes NOT DETECTED NOT DETECTED Final   Acinetobacter baumannii NOT DETECTED NOT DETECTED Final   Enterobacteriaceae species DETECTED (A) NOT DETECTED Final    Comment: Enterobacteriaceae represent a large family of gram-negative bacteria, not a single organism. CRITICAL RESULT CALLED TO, READ BACK BY AND VERIFIED WITH: JAMES LEDFORD [facility] 05/11/17 0055 BEAMJ    Enterobacter cloacae complex NOT DETECTED NOT DETECTED Final   Escherichia coli DETECTED (A) NOT DETECTED Final    Comment: CRITICAL RESULT CALLED TO, READ BACK BY AND VERIFIED WITH: JAMES LEDFORD [facility] 05/11/17 0055 BEAMJ    Klebsiella oxytoca NOT DETECTED NOT DETECTED Final   Klebsiella pneumoniae NOT DETECTED NOT DETECTED Final   Proteus species NOT DETECTED NOT DETECTED Final   Serratia marcescens NOT DETECTED NOT DETECTED Final   Carbapenem resistance NOT DETECTED NOT DETECTED Final   Haemophilus influenzae NOT DETECTED NOT DETECTED Final   Neisseria meningitidis NOT DETECTED NOT DETECTED Final   Pseudomonas aeruginosa DETECTED (A) NOT DETECTED Final    Comment: CRITICAL RESULT CALLED TO, READ BACK BY AND VERIFIED WITH: JAMES LEFFORD [facility] 05/11/17 0055 BEAMJ    Candida albicans NOT DETECTED NOT DETECTED Final   Candida glabrata NOT DETECTED NOT DETECTED Final   Candida krusei NOT DETECTED NOT DETECTED Final   Candida parapsilosis NOT DETECTED NOT DETECTED Final   Candida tropicalis NOT DETECTED NOT DETECTED Final  Blood Culture (routine x 2)     Status: None (Preliminary result)   Collection Time: 03/10/17 10:55 AM  Result Value Ref  Range Status   Specimen Description BLOOD RIGHT ANTECUBITAL  Final   Special Requests   Final    BOTTLES DRAWN AEROBIC AND ANAEROBIC Blood Culture adequate volume   Culture NO GROWTH 1 DAY  Final   Report Status PENDING  Incomplete   Studies/Results: Dg Chest 2 View  Result Date: 03/10/2017 CLINICAL DATA:  Chest pain. EXAM: CHEST  2 VIEW COMPARISON:  Radiographs of August 05, 2010. FINDINGS: The heart size and mediastinal contours are within normal limits. Both lungs are clear. Status post coronary artery bypass graft. No pneumothorax or pleural effusion is noted. The visualized skeletal structures are unremarkable. IMPRESSION: No active cardiopulmonary disease. Electronically Signed   By: Lupita RaiderJames  Green Jr, M.D.   On: 03/10/2017 10:50   Medications: I have reviewed the patient's current medications. Scheduled Meds: . gabapentin  100 mg Oral QHS  . insulin aspart  0-15 Units Subcutaneous TID WC  . insulin aspart  0-5 Units Subcutaneous  QHS  . pravastatin  20 mg Oral q1800  . propranolol  40 mg Oral TID   Continuous Infusions: . ceFEPime (MAXIPIME) IV Stopped (03/12/17 0805)   PRN Meds:.acetaminophen **OR** acetaminophen, ALPRAZolam, methocarbamol Assessment/Plan: Principal Problem:   Bacteremia Active Problems:   Lactic acidosis   Altered mental status   Sepsis (HCC)  Bacteremia (E.coli, Pseudomonas) Cefepime 2g IV @100ml /hr q8hrs Follow up bld cx taken 10/22&10/23 for growth and susceptibilities. Consider switching to oral abx for potential discharge after 48hours of no growth or fever  New onset Afib Follow up appointment with Dr. Pricilla Riffle at [facility] Eliquis per Dr. Andee Poles recommendation.   This is a Psychologist, occupational Note.  The care of the patient was discussed with Dr. Governor Specking and the assessment and plan formulated with their assistance.  Please see their attached note for official documentation of the daily encounter.   LOS: 1 day   Lisette Grinder, Medical Student  03/12/2017, 10:23 AM  My pager: [phone]

## 2017-03-13 LAB — CULTURE, BLOOD (ROUTINE X 2)

## 2017-03-13 LAB — CBC
HEMATOCRIT: 41 % (ref 39.0–52.0)
HEMOGLOBIN: 14.2 g/dL (ref 13.0–17.0)
MCH: 29.3 pg (ref 26.0–34.0)
MCHC: 34.6 g/dL (ref 30.0–36.0)
MCV: 84.7 fL (ref 78.0–100.0)
Platelets: 80 10*3/uL — ABNORMAL LOW (ref 150–400)
RBC: 4.84 MIL/uL (ref 4.22–5.81)
RDW: 13.6 % (ref 11.5–15.5)
WBC: 3.7 10*3/uL — ABNORMAL LOW (ref 4.0–10.5)

## 2017-03-13 LAB — GLUCOSE, CAPILLARY
Glucose-Capillary: 218 mg/dL — ABNORMAL HIGH (ref 65–99)
Glucose-Capillary: 262 mg/dL — ABNORMAL HIGH (ref 65–99)
Glucose-Capillary: 198 mg/dL — ABNORMAL HIGH (ref 65–99)
Glucose-Capillary: 223 mg/dL — ABNORMAL HIGH (ref 65–99)

## 2017-03-13 LAB — HEPATITIS C ANTIBODY: HCV Ab: <0.1 s/co ratio (ref 0.0–0.9)

## 2017-03-13 MED ORDER — FLUTICASONE PROPIONATE 50 MCG/ACT NA SUSP
1.0000 | Freq: Every day | NASAL | Status: DC | PRN
  Filled 2017-03-13: qty 16

## 2017-03-13 MED ORDER — LORATADINE 10 MG PO TABS: 10.0000 mg | ORAL_TABLET | Freq: Every day | ORAL | Status: DC

## 2017-03-13 NOTE — Progress Notes (Signed)
  Subjective: Patient continues to improve clinically. No overnight complaints. Has remained afebrile over 40 hours all other vital signs stable. He understands the need for a picc line and is willing to stay another night. All questions and concerns have been addressed.   Objective: Vital signs in last 24 hours: Vitals:   03/12/17 1540 03/12/17 2013 03/12/17 2300 03/13/17 0527  BP: (!) 142/80 116/70  124/64  Pulse: 86 81  91  Resp:  17  16  Temp:  98.3 F (36.8 C)  98.3 F (36.8 C)  TempSrc:  Oral  Oral  SpO2: 97% 97%  97%  Weight:   84.9 kg (187 lb 2.7 oz)   Height:   5\' 8"  (1.727 m)    Physical Exam  Constitutional: He is oriented to person, place, and time. He appears well-developed and well-nourished. He appears distressed.  Neck: Normal range of motion.  Cardiovascular:  No murmur heard. Irregularly irregular  Pulmonary/Chest: Effort normal and breath sounds normal. No respiratory distress. He has no wheezes. He has no rales. He exhibits no tenderness.  Abdominal: Soft. Bowel sounds are normal. He exhibits no distension. There is no tenderness.  Musculoskeletal: Normal range of motion.  Neurological: He is alert and oriented to person, place, and time.  Skin: He is not diaphoretic.      Studies/Results: No results found. Medications: I have reviewed the patient's current medications. Scheduled Meds: . apixaban  5 mg Oral BID  . gabapentin  100 mg Oral QHS  . insulin aspart  0-15 Units Subcutaneous TID WC  . insulin aspart  0-5 Units Subcutaneous QHS  . loratadine  10 mg Oral Daily  . pravastatin  20 mg Oral q1800  . propranolol  40 mg Oral TID   Continuous Infusions: . ceFEPime (MAXIPIME) IV Stopped (03/13/17 0558)   PRN Meds:.acetaminophen **OR** acetaminophen, ALPRAZolam, calcium carbonate, methocarbamol, sodium chloride Assessment/Plan: Principal Problem:   Bacteremia Active Problems:   Lactic acidosis   Altered mental status   Sepsis  (HCC)  Bacteremia (E.Coli, Pseudomonas) -Continues to improve clinicallly, afebrile >40hrs -Sensitivities returned showing E.Coli as pansensitive while pseudomonas is resistant to ciprofloxacin. -patient will require picc line in order to continue outpatient abx course -Cefipime 2g IV for 2 weeks following negative blood culture.   New onset Atrial Fibrillation  New onset afib [facility] patient[facility] Started on Eliquis 5mg  po bid  This is a Psychologist, occupationalMedical Student Note.  The care of the patient was discussed with Dr. Caron PresumeHelberg and the assessment and plan formulated with their assistance.  Please see their attached note for official documentation of the daily encounter.   LOS: 2 days   Lisette GrinderMcMillian, Kateleen Encarnacion A, Medical Student 03/13/2017, 11:33 AM  My Pager: [phone]

## 2017-03-13 NOTE — Discharge Instructions (Addendum)
Thank you for allowing us to provide your care.   - Please ensure you complete your antibiotic course. You last dose will be November 6th   - Please follow up with your cardiologist on November 13th at 9:00 AM  ----------------------------------------------------------------------------------------------------------------------------------------------------------------------------------------------------------------------  Information on my medicine - ELIQUIS (apixaban)  This medication education was reviewed with me or my healthcare representative as part of my discharge preparation.  The pharmacist that spoke with me during my hospital stay was:  Dennie Fettersgan, Theresa Donovan, [facility]  Why was Eliquis prescribed for you? Eliquis was prescribed for you to reduce the risk of a blood clot forming that can cause a stroke if you have a medical condition called atrial fibrillation (a type of irregular heartbeat).  What do You need to know about Eliquis ? Take your Eliquis TWICE DAILY - one tablet in the morning and one tablet in the evening with or without food. If you have difficulty swallowing the tablet whole please discuss with your pharmacist how to take the medication safely.  Take Eliquis exactly as prescribed by your doctor and DO NOT stop taking Eliquis without talking to the doctor who prescribed the medication.  Stopping may increase your risk of developing a stroke.  Refill your prescription before you run out.  After discharge, you should have regular check-up appointments with your healthcare provider that is prescribing your Eliquis.  In the future your dose may need to be changed if your kidney function or weight changes by a significant amount or as you get older.  What do you do if you miss a dose? If you miss a dose, take it as soon as you remember on the same day and resume taking twice daily.  Do not take more than one dose of ELIQUIS at the same time to make up a missed  dose.  Important Safety Information A possible side effect of Eliquis is bleeding. You should call your healthcare provider right away if you experience any of the following: ? Bleeding from an injury or your nose that does not stop. ? Unusual colored urine (red or dark [place]) or unusual colored stools (red or black). ? Unusual bruising for unknown reasons. ? A serious fall or if you hit your head (even if there is no bleeding).  Some medicines may interact with Eliquis and might increase your risk of bleeding or clotting while on Eliquis. To help avoid this, consult your healthcare provider or pharmacist prior to using any new prescription or non-prescription medications, including herbals, vitamins, non-steroidal anti-inflammatory drugs (NSAIDs) and supplements.  This website has more information on Eliquis (apixaban): http://www.eliquis.com/eliquis/home

## 2017-03-13 NOTE — Progress Notes (Signed)
   Subjective: Doing well this am with no questions or concerns. Would like to go home. Discussed the plan to wait on antibiotic sensitivities and depending on the results we will determine whether he may be discharged today or tomorrow. He is reluctant to get a PICC. His wife had one before and he states that they are a lot of work. All questions and concerns addressed.   Returned to speak with patient after rounds. Informed that he will need a PICC line with outpatient antibiotic therapy. Will consult case management to help coordinate care. [facility] and will need home health.   Objective: Vital signs in last 24 hours: Vitals:   03/12/17 1426 03/12/17 1540 03/12/17 2013 03/12/17 2300  BP: 133/62 (!) 142/80 116/70   Pulse: 80 86 81   Resp: 18  17   Temp: 97.8 F (36.6 C)  98.3 F (36.8 C)   TempSrc: Oral  Oral   SpO2: 99% 97% 97%   Weight:    187 lb 2.7 oz (84.9 kg)  Height:    5\' 8"  (1.727 m)   General:Obese male resting comfortably in bed  Pulm:Good air movement, no wheezing or crackles  ZO:[id number]:Irregularly irregular, systolic murmur  Abdomen:Active bowel sounds, soft, no tenderness to palpation   Assessment/Plan:  1. Sepsis 2/2 E. Coli and Pseudomonas Bacteremia - Continues to be clinically stable, afebrile >24 hours - Unclear source at this point. CXR, UA, TTE unrevealing - Repeat BC on 10/23 showing NGTD  - Unfortunately Pseudomonas susceptibilities returned and it is not sensitive to flouroquinolones. Will need PICC line placement for IV Cefepime and case management consult for help with coordination to home health and [facility] for medications.   2. Atrial Fibrillation, new diagnosis  - Currently rate controlled with Propanolol  - [facility] Started on Eliquis on 10/24 and will [facility] with the [facility] on 11/13 @ 9:00AM  3. Aortic Stenosis  - Asymptomatic   4. Pancytopenia  - WBC 3.4, Hgb 13.3, and Plt 84  - In the  setting of bacterial sepsis - Needs to be followed up as an outpatient   5. Diabetes Mellitus  - Sugars well controlled since admission  - SSI  6. Hypertension  - May resume home medications on discharge. Were held in the setting of sepsis   7. 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 Electrolyte derangements  - Monitor and replete as needed   Type II MI. [place]  Dispo: Discharge tomorrow pending PICC line placement and coordination for outpatient antibiotics   Levora DredgeHelberg, Myracle Febres, MD 03/13/2017, 5:01 AM My Pager: [phone]

## 2017-03-14 DIAGNOSIS — Z823 Family history of stroke: Secondary | ICD-10-CM

## 2017-03-14 LAB — GLUCOSE, CAPILLARY
GLUCOSE-CAPILLARY: 208 mg/dL — AB (ref 65–99)
Glucose-Capillary: 227 mg/dL — ABNORMAL HIGH (ref 65–99)
Glucose-Capillary: 196 mg/dL — ABNORMAL HIGH (ref 65–99)

## 2017-03-14 MED ORDER — SODIUM CHLORIDE 0.9% FLUSH
10.0000 mL | INTRAVENOUS | Status: DC | PRN
  Administered 2017-03-15: 10 mL
  Filled 2017-03-14: qty 40

## 2017-03-14 NOTE — Discharge Summary (Signed)
Name: Shane Peterson MRN: [id number] DOB: 29-Sep-1945 71 y.o. PCP: [facility]  Date of Admission: 03/10/2017  9:17 AM Date of Discharge: 03/15/2017 Attending Physician: Axel Filler, *  Discharge Diagnosis: 1. E. Coli and Pseudomonas Bacteremia  2. Paroxysmal Atrial Fibrillation   Principal Problem:   Bacteremia Active Problems:   Lactic acidosis   Altered mental status   Sepsis ([place])  Discharge Medications: Allergies as of 03/15/2017      Reactions   Codeine Other (See Comments)   hallucinations   Sulfonamide Derivatives Swelling   Throat swelling      Medication List    STOP taking these medications   diltiazem 120 MG 24 hr capsule Commonly known as:  CARDIZEM CD     TAKE these medications   amLODipine 10 MG tablet Commonly known as:  NORVASC Take 10 mg by mouth daily.   apixaban 5 MG Tabs tablet Commonly known as:  ELIQUIS Take 1 tablet (5 mg total) by mouth 2 (two) times daily.   aspirin 81 MG chewable tablet Chew 81 mg by mouth daily.   ceFEPime IVPB Commonly known as:  MAXIPIME Inject 2 g into the vein every 8 (eight) hours. Indication:  bacteremia Last Day of Therapy:  03/25/17 Labs - Once weekly:  CBC/D and BMP, Labs - Every other week:  ESR and CRP   cetirizine 10 MG tablet Commonly known as:  ZYRTEC Take 10 mg by mouth at bedtime.   cyanocobalamin 1000 MCG/ML injection Commonly known as:  (VITAMIN B-12) Inject 1,000 mcg into the muscle every 30 (thirty) days.   esomeprazole 40 MG capsule Commonly known as:  NEXIUM Take 40 mg by mouth 2 (two) times daily before a meal.   Fish Oil 1000 MG Caps Take 3 capsules by mouth 2 (two) times daily.   fluticasone 50 MCG/ACT nasal spray Commonly known as:  FLONASE Place 1 spray into both nostrils daily as needed for allergies or rhinitis.   gabapentin 100 MG capsule Commonly known as:  NEURONTIN Take 300-400 mg by mouth See admin instructions. 383m in am, 4042min pm     glipiZIDE 5 MG tablet Commonly known as:  GLUCOTROL Take 10 mg by mouth 3 (three) times daily. What changed:  Another medication with the same name was removed. Continue taking this medication, and follow the directions you see here.   lisinopril 40 MG tablet Commonly known as:  PRINIVIL,ZESTRIL Take 40 mg by mouth daily.   meloxicam 15 MG tablet Commonly known as:  MOBIC Take 15 mg by mouth daily.   methocarbamol 500 MG tablet Commonly known as:  ROBAXIN Take 500 mg by mouth every 8 (eight) hours as needed for muscle spasms.   nitroGLYCERIN 0.4 MG SL tablet Commonly known as:  NITROSTAT Place 0.4 mg under the tongue every 5 (five) minutes as needed for chest pain.   pravastatin 40 MG tablet Commonly known as:  PRAVACHOL Take 40 mg by mouth daily.   propranolol 20 MG tablet Commonly known as:  INDERAL Take 40 mg by mouth 3 (three) times daily.   sitaGLIPtin 100 MG tablet Commonly known as:  JANUVIA Take 100 mg by mouth daily.   sodium chloride 0.65 % Soln nasal spray Commonly known as:  OCEAN Place 1 spray into both nostrils as needed for congestion.   Vitamin D 2000 units tablet Take 2,000 Units by mouth daily.            Home Infusion Instuctions  Start     Ordered   03/15/17 0000  Home infusion instructions [facility] May follow [place] Dosing Protocol; May administer Cathflo as needed to maintain patency of vascular access device.; Flushing of vascular access device: per [facility] Protocol: 0.9% NaCl pre/post medica...    Question Answer Comment  Instructions May follow [place] Dosing Protocol   Instructions May administer Cathflo as needed to maintain patency of vascular access device.   Instructions Flushing of vascular access device: per [facility] Protocol: 0.9% NaCl pre/post medication administration and prn patency; Heparin 100 u/ml, 31m for implanted ports and Heparin 10u/ml, 55mfor all other central venous catheters.   Instructions May  follow [facility] Anaphylaxis Protocol for First Dose Administration in the home: 0.9% NaCl at 25-50 ml/hr to maintain IV access for protocol meds. Epinephrine 0.3 ml IV/IM PRN and Benadryl 25-50 IV/IM PRN s/s of anaphylaxis.   Instructions [facility] Infusion Coordinator (RN) to assist per patient IV care needs in the home PRN.      03/15/17 0953     Disposition and follow-up:   [facility]  At the hospital follow up visit please address:  1.  Gram negative bacteremia. Sterile culture date 10/23. Please ensure he completes his 2 week antibiotic course. PAF. Currently on Eliquis and Propanolol.   2.  Labs / imaging needed at time of follow-up: None  3.  Pending labs/ test needing follow-up: None  Follow-up Appointments: [facility] @ 9[facility]00 AM  Hospital Course by problem list: Principal Problem:   Bacteremia Active Problems:   Lactic acidosis   Altered mental status   Sepsis ([place]  1. E. Coli and Pseudomonas Bacteremia. Shane Peterson a [age].o. Male with a PMHx significant for HTN, CAD s/p 5 vessel CABG in 2002, and DM who presented to the ED with acute onset rigor, chest pain, and altered mental status. In the ED he was found to be febrile and code sepsis was initiated. Blood cultures were obtained and subsequently grew pansensitive E. Coli and Pseudomonas resistant to ciprofloxacin. Chest x-ray was obtained that illustrated no opacities or infiltrates. Urine analysis had no pyuria or bacteruria. There were no soft tissue infections isolated, and the patient denies symptoms that would be concerning for prostatitis. He did have some loose stools prior to presentation but it was only 3 bowel movements and never recurred. Infectious disease was consulted to look for probable sources. Transthoracic echocardiogram was obtained that showed no evidence of endocarditis, vegetations, or valvular  abnormalities. Repeat blood cultures were obtained on 10/23 and will serve as the reference point for antibiotic therapy. The patient received a PICC and [facility] [place] [place] with an end date of November 6th.    2. Paroxysmal Atrial Fibrillation. During his hospitalization Mr. BrFieldhouseonverted to atrial fibrillation and eventually converted back to sinus prior to discharge. He was previously on propanolol for HTN and an essential tremor. His beta blocker was not switched as he was rate controlled on the medication. We contacted Dr. HoArletha [facility]rippet [facility] and it appears this is a new diagnosis. She request we start Eliquis after obtaining coagulation studies (PT 14.6, INR 1.15, and PTT 35). Although he converted back to sinus rhythm and this episode was likely precipitated by his bacteremia, we continued Eliquis on discharge. His CHDSVASC score  was >3 and he expressed his worries about having a stroke since his mother had multiple and subsequently died due to one. It also sounds as though he converts into and out of A-fib on a regular basis. He states that he feels his heart beat that way for days then feels it go back to normal. He has a follow-up visit with Dr. Reece Agar on 04/01/2017 at 9:00 AM.   Discharge Vitals:   BP 124/62   Pulse 63   Temp 98 F (36.7 C) (Oral)   Resp 16   Ht _0  (1.727 m)   Wt 187 lb 2.7 oz (84.9 kg)   SpO2 96%   BMI 28.46 kg/m   Pertinent Labs, Studies, and Procedures:  Results for orders placed or performed during the hospital encounter of 03/10/17  Urine culture     Status: None   Collection Time: 03/10/17  9:59 AM  Result Value Ref Range Status   Specimen Description URINE, CLEAN CATCH  Final   Special Requests NONE  Final   Culture NO GROWTH  Final   Report Status 03/11/2017 FINAL  Final  Blood Culture (routine x 2)     Status: Abnormal   Collection Time: 03/10/17 10:09 AM  Result Value Ref  Range Status   Specimen Description BLOOD LEFT FOREARM  Final   Special Requests   Final    BOTTLES DRAWN AEROBIC AND ANAEROBIC Blood Culture results may not be optimal due to an inadequate volume of blood received in culture bottles   Culture  Setup Time   Final    GRAM NEGATIVE RODS IN BOTH AEROBIC AND ANAEROBIC BOTTLES CRITICAL RESULT CALLED TO, READ BACK BY AND VERIFIED WITH: JAMES LEDFORD [facility] 05/11/17 0055 BEAMJ    Culture ESCHERICHIA COLI PSEUDOMONAS AERUGINOSA  (A)  Final   Report Status 03/13/2017 FINAL  Final   Organism ID, Bacteria ESCHERICHIA COLI  Final   Organism ID, Bacteria PSEUDOMONAS AERUGINOSA  Final      Susceptibility   Escherichia coli - MIC*    AMPICILLIN <=2 SENSITIVE Sensitive     CEFAZOLIN <=4 SENSITIVE Sensitive     CEFEPIME <=1 SENSITIVE Sensitive     CEFTAZIDIME <=1 SENSITIVE Sensitive     CEFTRIAXONE <=1 SENSITIVE Sensitive     CIPROFLOXACIN <=0.25 SENSITIVE Sensitive     GENTAMICIN <=1 SENSITIVE Sensitive     IMIPENEM <=0.25 SENSITIVE Sensitive     TRIMETH/SULFA <=20 SENSITIVE Sensitive     AMPICILLIN/SULBACTAM <=2 SENSITIVE Sensitive     PIP/TAZO <=4 SENSITIVE Sensitive     Extended ESBL NEGATIVE Sensitive     * ESCHERICHIA COLI   Pseudomonas aeruginosa - MIC*    CEFTAZIDIME <=1 SENSITIVE Sensitive     CIPROFLOXACIN >=4 RESISTANT Resistant     GENTAMICIN <=1 SENSITIVE Sensitive     IMIPENEM <=0.25 SENSITIVE Sensitive     PIP/TAZO <=4 SENSITIVE Sensitive     CEFEPIME <=1 SENSITIVE Sensitive     * PSEUDOMONAS AERUGINOSA  Blood Culture ID Panel (Reflexed)     Status: Abnormal   Collection Time: 03/10/17 10:09 AM  Result Value Ref Range Status   Enterococcus species NOT DETECTED NOT DETECTED Final   Listeria monocytogenes NOT DETECTED NOT DETECTED Final   Staphylococcus species NOT DETECTED NOT DETECTED Final   Staphylococcus aureus NOT DETECTED NOT DETECTED Final   Streptococcus species NOT DETECTED NOT DETECTED Final   Streptococcus  agalactiae NOT DETECTED NOT DETECTED Final   Streptococcus pneumoniae NOT DETECTED NOT DETECTED Final  Streptococcus pyogenes NOT DETECTED NOT DETECTED Final   Acinetobacter baumannii NOT DETECTED NOT DETECTED Final   Enterobacteriaceae species DETECTED (A) NOT DETECTED Final    Comment: Enterobacteriaceae represent a large family of gram-negative bacteria, not a single organism. CRITICAL RESULT CALLED TO, READ BACK BY AND VERIFIED WITH: JAMES LEDFORD [facility] 05/11/17 0055 BEAMJ    Enterobacter cloacae complex NOT DETECTED NOT DETECTED Final   Escherichia coli DETECTED (A) NOT DETECTED Final    Comment: CRITICAL RESULT CALLED TO, READ BACK BY AND VERIFIED WITH: JAMES LEDFORD [facility] 05/11/17 0055 BEAMJ    Klebsiella oxytoca NOT DETECTED NOT DETECTED Final   Klebsiella pneumoniae NOT DETECTED NOT DETECTED Final   Proteus species NOT DETECTED NOT DETECTED Final   Serratia marcescens NOT DETECTED NOT DETECTED Final   Carbapenem resistance NOT DETECTED NOT DETECTED Final   Haemophilus influenzae NOT DETECTED NOT DETECTED Final   Neisseria meningitidis NOT DETECTED NOT DETECTED Final   Pseudomonas aeruginosa DETECTED (A) NOT DETECTED Final    Comment: CRITICAL RESULT CALLED TO, READ BACK BY AND VERIFIED WITH: JAMES LEFFORD [facility] 05/11/17 0055 BEAMJ    Candida albicans NOT DETECTED NOT DETECTED Final   Candida glabrata NOT DETECTED NOT DETECTED Final   Candida krusei NOT DETECTED NOT DETECTED Final   Candida parapsilosis NOT DETECTED NOT DETECTED Final   Candida tropicalis NOT DETECTED NOT DETECTED Final  Blood Culture (routine x 2)     Status: None (Preliminary result)   Collection Time: 03/10/17 10:55 AM  Result Value Ref Range Status   Specimen Description BLOOD RIGHT ANTECUBITAL  Final   Special Requests   Final    BOTTLES DRAWN AEROBIC AND ANAEROBIC Blood Culture adequate volume   Culture  Setup Time   Final    GRAM NEGATIVE RODS ANAEROBIC BOTTLE ONLY CRITICAL RESULT CALLED TO,  READ BACK BY AND VERIFIED WITH: N.GADAZA PHARMD, AT 0756 03/15/17 BY D. VANHOOK    Culture GRAM NEGATIVE RODS  Final   Report Status PENDING  Incomplete  Culture, blood (routine x 2)     Status: None (Preliminary result)   Collection Time: 03/11/17  9:15 AM  Result Value Ref Range Status   Specimen Description BLOOD RIGHT HAND  Final   Special Requests IN PEDIATRIC BOTTLE Blood Culture adequate volume  Final   Culture NO GROWTH 4 DAYS  Final   Report Status PENDING  Incomplete  Culture, blood (routine x 2)     Status: None (Preliminary result)   Collection Time: 03/11/17  9:15 AM  Result Value Ref Range Status   Specimen Description BLOOD LEFT HAND  Final   Special Requests IN PEDIATRIC BOTTLE Blood Culture adequate volume  Final   Culture NO GROWTH 4 DAYS  Final   Report Status PENDING  Incomplete   TTE Study Conclusions - Left ventricle: The cavity size was normal. Systolic function was   normal. The estimated ejection fraction was in the range of 60%   to 65%. Wall motion was normal; there were no regional wall   motion abnormalities. Left ventricular diastolic function   parameters were normal. - Pulmonary arteries: PA peak pressure: 37 mm Hg (S).  Impressions: - The right ventricular systolic pressure was increased consistent   with mild pulmonary hypertension.  Discharge Instructions: Discharge Instructions    Diet - low sodium heart healthy    Complete by:  As directed    Home infusion instructions [facility] May follow [place] Dosing Protocol; May administer Cathflo as needed  to maintain patency of vascular access device.; Flushing of vascular access device: per [facility] Protocol: 0.9% NaCl pre/post medica...    Complete by:  As directed    Instructions:  May follow [place] Dosing Protocol   Instructions:  May administer Cathflo as needed to maintain patency of vascular access device.   Instructions:  Flushing of vascular access device: per [facility] Protocol:  0.9% NaCl pre/post medication administration and prn patency; Heparin 100 u/ml, 73m for implanted ports and Heparin 10u/ml, 541mfor all other central venous catheters.   Instructions:  May follow [facility] Anaphylaxis Protocol for First Dose Administration in the home: 0.9% NaCl at 25-50 ml/hr to maintain IV access for protocol meds. Epinephrine 0.3 ml IV/IM PRN and Benadryl 25-50 IV/IM PRN s/s of anaphylaxis.   Instructions:  [facility] Coordinator (RN) to assist per patient IV care needs in the home PRN.   Increase activity slowly    Complete by:  As directed      Signed: HeIna HomesMD 03/15/2017, 9:53 AM   My Pager: [phone]

## 2017-03-14 NOTE — Care Management Note (Addendum)
Case Management Note  Patient Details  Name: Shane Peterson MRN: [id number] Date of Birth: Oct 10, 1945  Subjective/Objective:      Admitted with sepsis due to E Coli and Pseudomonas bacteremia, hx of CAD, s/p CABG. From home with wife.   Leonie DouglasCynthia Peterson (Spouse)     [phone]      PC[facility]:   [facility] : Shane [phone],[phone] [phone] or pager [phone]  [facility]: [facility] Needs [facility] health for IV antibiotics and needs [facility] with the [facility] for [facility]. CM aware of IV home therapy set up needed.  CM has made contact with the [facility], and information was [facility] requesting required information in order for the [facility] to approve and provide services to the pt.  CM has gathered most reports requested by the [facility], still awaiting PICC line report, medication orders, d/c summary/ instruction. H&P, ID report, lab studies, radiology reports,and cultures reports were faxed to to [phone] ( ID Clinic/VA) and  [phone],[place] [facility] Shane Rieger(Laura).   03/14/2017 @ 1455  CM faxed PICC report to  [phone] ( ID Clinic/VA) and  [phone],[place] [facility] Shane Rieger(Laura).    Expected Discharge Date:       03/14/2017           Expected Discharge Plan:  Home w Home Health Services  In-House Referral:    Discharge planning Services  CM Consult  Post Acute Care Choice:    Choice offered to:  Patient  DME Arranged:   Pharmacy DME Agency:     [phone],[phone]   HH Arranged:   [facility]  [facility] Agency:    [phone],[phone], please call with any questions regarding pt's home health nursing service and  IV medication therapy.  Status of Service:     If discussed at Long Length of Stay Meetings, dates discussed:    Additional Comments:  Epifanio LeschesCole, Shane Strine Hudson, RN 03/14/2017, 10:42 AM

## 2017-03-14 NOTE — Progress Notes (Signed)
CM received consult : New apixaban. Benefits check in process for apixaban . CM to provide and explain apixaban 30 day free card to pt. CM to f/u with results of benefits check. Gae GallopAngela Rmoni Keplinger

## 2017-03-14 NOTE — Care Management Important Message (Signed)
Important Message  Patient Details  Name: Shane Peterson MRN: [id number] Date of Birth: [birth year]   Medicare Important Message Given:  Yes    Kyla BalzarineShealy, Antonin Meininger Abena 03/14/2017, 10:31 AM

## 2017-03-14 NOTE — Progress Notes (Signed)
Peripherally Inserted Central Catheter/Midline Placement  The IV Nurse has discussed with the patient and/or persons authorized to consent for the patient, the purpose of this procedure and the potential benefits and risks involved with this procedure.  The benefits include less needle sticks, lab draws from the catheter, and the patient may be discharged home with the catheter. Risks include, but not limited to, infection, bleeding, blood clot (thrombus formation), and puncture of an artery; nerve damage and irregular heartbeat and possibility to perform a PICC exchange if needed/ordered by physician.  Alternatives to this procedure were also discussed.  Bard Power PICC patient education guide, fact sheet on infection prevention and patient information card has been provided to patient /or left at bedside.    PICC/Midline Placement Documentation  PICC Single Lumen 03/14/17 PICC Right Brachial 41 cm 1 cm (Active)  Indication for Insertion or Continuance of Line Home intravenous therapies (PICC only) 03/14/2017  1:27 PM  Exposed Catheter (cm) 1 cm 03/14/2017  1:27 PM  Site Assessment Clean;Dry;Intact 03/14/2017  1:27 PM  Line Status Flushed;Saline locked;Blood return noted 03/14/2017  1:27 PM  Dressing Type Transparent 03/14/2017  1:27 PM  Dressing Status Clean;Dry;Intact;Antimicrobial disc in place 03/14/2017  1:27 PM  Dressing Change Due 03/21/17 03/14/2017  1:27 PM       [name], [name] 03/14/2017, 1:28 PM

## 2017-03-14 NOTE — Progress Notes (Signed)
PHARMACY CONSULT NOTE FOR:  OUTPATIENT  PARENTERAL ANTIBIOTIC THERAPY (OPAT)  Indication: bacteremia (E coli and Pseudomonas) Regimen: Cefepime 2gm IV q8hrs End date: 03/25/17  IV antibiotic discharge orders are pended. To discharging provider:  please sign these orders via discharge navigator,  Select New Orders & click on the button choice - Manage This Unsigned Work.     Thank you for allowing pharmacy to be a part of this patient's care.  Dennie Fettersgan, Taliah Porche [place], [place] Pager: [phone] 03/14/2017, 12:53 PM

## 2017-03-14 NOTE — Progress Notes (Signed)
   Subjective: Doing well this AM. Continuing to wait for PICC placement. Concerned/worried about the discharge plan. States that [facility] yesterday and was told he would have to wait a month to get his antibiotics. Discussed that I will call case management and we will not dicharge him without a plan in place. Express concern about the 3 times a day dosing and does not feel he or his wife can do it without home health aide.  Now in sinus rhythm per tele. States his HR feels normal. He says that he feels he goes into and out of A-fib all the time. Would like to continue anticoagulation as his mother had several CVAs and died from one. All questions and concerns addressed.   Objective: Vital signs in last 24 hours: Vitals:   03/12/17 2300 03/13/17 0527 03/13/17 1436 03/13/17 2048  BP:  124/64 124/63 129/77  Pulse:  91 61 64  Resp:  16 17 16   Temp:  98.3 F (36.8 C) (!) 97.5 F (36.4 C) 98.1 F (36.7 C)  TempSrc:  Oral Oral Oral  SpO2:  97% 100% 98%  Weight: 187 lb 2.7 oz (84.9 kg)     Height: 5\' 8"  (1.727 m)      General: Obese male resting comfortably in the recliner  Pulm: Good air movement, no wheezing or crackles  CV: RRR Abdomen: Active bowel sounds, soft, no tenderness to palpation  Extremities: No LE edema.  Assessment/Plan:  1. Sepsis 2/2 E. Coli and Pseudomonas Bacteremia - Continues to be clinically stable, afebrile >48 hours - Unclear source at this point. CXR, UA, TTE unrevealing - Repeat BC 10/23 have shown NGTD - Awaiting for PICC line placement for 2 weeks of IV Cefepime (start date 10/23, end date 11/6) - Touch base with Case management about coordination with [facility] and home health   2. Paroxysmal Atrial Fibrillation - Currently back in sinus  - Continue propanolol for HTN and tremor  - [facility] Start[facility] on Eliquis on 10/24 and will follow-up with the [facility] on 11/13 @ 9:00AM  3. Aortic Stenosis  - Asymptomatic   4.  Pancytopenia  - WBC 3.4, Hgb 13.3, and Plt 84  - In the setting of bacterial sepsis - Needs to be followed up as an outpatient   5. Diabetes Mellitus  - Sugars well controlled since admission  - SSI  6. Hypertension  - May resume home medications on discharge. Were held in the setting of sepsis   7. 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 Electrolyte derangements  - Monitor and replete as needed   Type II MI. Roslved  Dispo: Pending PICC line placement and case management coordination.   Levora DredgeHelberg, Neiko Trivedi, MD 03/14/2017, 5:08 AM My Pager: [phone]

## 2017-03-15 DIAGNOSIS — I480 Paroxysmal atrial fibrillation: Secondary | ICD-10-CM

## 2017-03-15 DIAGNOSIS — Z1639 Resistance to other specified antimicrobial drug: Secondary | ICD-10-CM

## 2017-03-15 DIAGNOSIS — R251 Tremor, unspecified: Secondary | ICD-10-CM

## 2017-03-15 LAB — GLUCOSE, CAPILLARY
Glucose-Capillary: 282 mg/dL — ABNORMAL HIGH (ref 65–99)
Glucose-Capillary: 216 mg/dL — ABNORMAL HIGH (ref 65–99)

## 2017-03-15 MED ORDER — CEFEPIME IV (FOR PTA / DISCHARGE USE ONLY): 2.0000 g | Freq: Three times a day (TID) | INTRAVENOUS | 0 refills | Status: AC

## 2017-03-15 MED ORDER — HEPARIN SOD (PORK) LOCK FLUSH 100 UNIT/ML IV SOLN
250.0000 [IU] | INTRAVENOUS | Status: AC | PRN
  Administered 2017-03-15: 250 [IU]

## 2017-03-15 MED ORDER — APIXABAN 5 MG PO TABS: 5.0000 mg | ORAL_TABLET | Freq: Two times a day (BID) | ORAL | 0 refills | Status: AC

## 2017-03-15 NOTE — Progress Notes (Signed)
   Subjective: Doing well this AM. Ready to go home. All questions and concerns addressed. Spoke with Case management this AM. [facility] this afternoon and home health will meet the patient this afternoon at his home. [facility]   Objective: Vital signs in last 24 hours: Vitals:   03/13/17 2048 03/14/17 0655 03/14/17 1413 03/14/17 2157  BP: 129/77 138/73 133/60 128/70  Pulse: 64 (!) 58 (!) 59 (!) 59  Resp: 16 16 14    Temp: 98.1 F (36.7 C) 98 F (36.7 C) 98.1 F (36.7 C) 98.3 F (36.8 C)  TempSrc: Oral Oral Oral Axillary  SpO2: 98% 98% 97% 97%  Weight:      Height:       General: Obese male resting comfortably in the recliner  Pulm: Good air movement  CV: RRR, currently in sinus per tele Extremities: No LE edema   Assessment/Plan:  1. Sepsis 2/2 E. Coli and Pseudomonas Bacteremia - Continues to be clinically stable and afebrile - Unclear source at this point. CXR, UA, TTE unrevealing - Repeat BC 10/23 have shown NGTD - PICC line placed yesterday. Home health meeting today for 2 weeks of IV Cefepime (start date 10/23, end date 11/6) - Case management has discussed the patient[facility]. Plan is set for discharge. Pharmacy has provided the prescription for the IV Cefepime and Home Health will be by to see the patient today.   2. Paroxysmal Atrial Fibrillation - Currently back in sinus  - Continue propanolol for HTN and tremor  - [facility]Dr[facility] Started on Eliquis on 10/24 and will follow-up with the [facility] on 11/13 @ 9:00AM  3. Aortic Stenosis  - Asymptomatic   4. Pancytopenia  - WBC 3.4, Hgb 13.3, and Plt 84  - In the setting of bacterial sepsis - Needs to be followed up as an outpatient   5. Diabetes Mellitus  - Resume home medications on discharge   6. Hypertension  - Resume home medications on discharge   7. Ischemic Heart Disease s/p 5 vessel CABG  2002 - Resume all home medications on discharge  Type II MI. Roslved  Dispo: Anticipated discharge in approximately 0 day(s).   Levora DredgeHelberg, Alejandria Wessells, MD 03/15/2017, 5:06 AM  My Pager: [phone]

## 2017-03-15 NOTE — Progress Notes (Signed)
Patient discharge teaching given, including activity, diet, follow-up appoints, and medications. Patient verbalized understanding of all discharge instructions. IV access was d/c'd. Vitals are stable. Skin is intact except as charted in most recent assessments. 

## 2017-03-15 NOTE — Progress Notes (Addendum)
[facility] to [facility] at [phone] and note Rx and Facesheet to [facility] at [phone].  Next dose due at 2pm, awaiting to hear back [facility] [phone] Spoke w Alexis FrockLaura F w [facility], she confirms that RN will be at house today at 2pm w IV meds to infuse.

## 2017-03-15 NOTE — Progress Notes (Signed)
PHARMACY - PHYSICIAN COMMUNICATION CRITICAL VALUE ALERT - BLOOD CULTURE IDENTIFICATION (BCID)  Lab called pharmacy to report that 1/2 blood cultures from 10/22 were growing gram neg rods. Pt already on appropriate antibiotic therapy  Changes to prescribed antibiotics required: No changes needed  [place] PinkGazda, Talley Kreiser P 03/15/2017  7:58 AM

## 2017-03-16 LAB — CULTURE, BLOOD (ROUTINE X 2)
CULTURE: NO GROWTH
CULTURE: NO GROWTH
SPECIAL REQUESTS: ADEQUATE
Special Requests: ADEQUATE
Special Requests: ADEQUATE

## 2017-04-13 ENCOUNTER — Other Ambulatory Visit: Payer: Self-pay | Admitting: Internal Medicine

## 2017-10-03 ENCOUNTER — Ambulatory Visit (INDEPENDENT_AMBULATORY_CARE_PROVIDER_SITE_OTHER): Payer: Medicare HMO | Admitting: Podiatry

## 2017-10-03 ENCOUNTER — Encounter: Payer: Self-pay | Admitting: Podiatry

## 2017-10-03 ENCOUNTER — Ambulatory Visit (INDEPENDENT_AMBULATORY_CARE_PROVIDER_SITE_OTHER): Payer: Medicare HMO

## 2017-10-03 VITALS — BP 150/71 | HR 57 | Resp 16

## 2017-10-03 DIAGNOSIS — M79676 Pain in unspecified toe(s): Secondary | ICD-10-CM

## 2017-10-03 DIAGNOSIS — M779 Enthesopathy, unspecified: Secondary | ICD-10-CM | POA: Diagnosis not present

## 2017-10-03 DIAGNOSIS — L600 Ingrowing nail: Secondary | ICD-10-CM | POA: Diagnosis not present

## 2017-10-03 DIAGNOSIS — M7752 Other enthesopathy of left foot: Secondary | ICD-10-CM

## 2017-10-03 DIAGNOSIS — M7672 Peroneal tendinitis, left leg: Secondary | ICD-10-CM

## 2017-10-03 MED ORDER — DICLOFENAC SODIUM 1 % TD GEL: 2.0000 g | Freq: Four times a day (QID) | TRANSDERMAL | 1 refills | Status: DC

## 2017-10-03 NOTE — Patient Instructions (Signed)

## 2017-10-03 NOTE — Progress Notes (Signed)
Subjective:  Patient ID: Shane Peterson, male    DOB: November 24, 1945,  MRN: [id number]  Chief Complaint  Patient presents with  . Toe Pain    Hallux right - medial border, tender x 2 weeks, red, draining last week, peroxide and neosprin, does look better  . Ankle Pain    Lateral ankle left - aching x couple months, PC injected, but still tender  . Diabetes    LAst a1c was 8.1  . New Patient (Initial Visit)   72 y.o. male presents with the above complaint.  Reports pain to the right great toe for about 2 weeks.  Endorses redness drainage.  Has used naproxen in the spine but states he does have a little better.  Also complains of pain to the outside of the left ankle for a couple months.  States that his PCP gave him an injection but is still tender.  Past Medical History:  Diagnosis Date  . Coronary artery disease   . Family history of adverse reaction to anesthesia    "daughter get sick" (03/10/2017)  . GERD (gastroesophageal reflux disease)   . HLD (hyperlipidemia)   . HTN (hypertension)   . IHD (ischemic heart disease)   . Palpitations   . PONV (postoperative nausea and vomiting)   . PVC's (premature ventricular contractions)   . Type II diabetes mellitus (HCC)    Past Surgical History:  Procedure Laterality Date  . ANTERIOR CERVICAL DECOMP/DISCECTOMY FUSION  05/2000   [name] 10/02/2010  . APPENDECTOMY  1962  . BACK SURGERY    . CARDIAC CATHETERIZATION  07/2000; 07/2001, 08/2006   [name] 10/02/2010  . CORONARY ARTERY BYPASS GRAFT  07/2000   CABG X "5"  . LAPAROSCOPIC CHOLECYSTECTOMY  01/2009   [name] 02/24/2009  . POSTERIOR CERVICAL FUSION/FORAMINOTOMY  2001   foraminectomy/notes 10/02/2010  . TONSILLECTOMY      Current Outpatient Medications:  .  ALOGLIPTIN-PIOGLITAZONE PO, Take by mouth., Disp: , Rfl:  .  METFORMIN HCL PO, Take by mouth., Disp: , Rfl:  .  Alcohol Swabs (ALCOHOL PREP) PADS, by Does not apply route., Disp: , Rfl:  .  ALPRAZolam (XANAX) 0.5 MG tablet, Take  by mouth., Disp: , Rfl:  .  amLODipine (NORVASC) 10 MG tablet, Take 10 mg by mouth daily., Disp: , Rfl:  .  apixaban (ELIQUIS) 5 MG TABS tablet, Take 1 tablet (5 mg total) by mouth 2 (two) times daily., Disp: 60 tablet, Rfl: 0 .  aspirin 81 MG chewable tablet, Chew 81 mg by mouth daily., Disp: , Rfl:  .  cetirizine (ZYRTEC) 10 MG tablet, Take 10 mg by mouth at bedtime. , Disp: , Rfl:  .  Cholecalciferol (VITAMIN D) 2000 UNITS tablet, Take 2,000 Units by mouth daily., Disp: , Rfl:  .  cyanocobalamin (,VITAMIN B-12,) 1000 MCG/ML injection, Inject 1,000 mcg into the muscle every 30 (thirty) days., Disp: , Rfl:  .  diclofenac sodium (VOLTAREN) 1 % GEL, Apply 2 g topically 4 (four) times daily., Disp: 100 g, Rfl: 1 .  esomeprazole (NEXIUM) 40 MG capsule, Take 40 mg by mouth 2 (two) times daily before a meal., Disp: , Rfl:  .  fluticasone (FLONASE) 50 MCG/ACT nasal spray, Place 1 spray into both nostrils daily as needed for allergies or rhinitis., Disp: , Rfl:  .  fluticasone (VERAMYST) 27.5 MCG/SPRAY nasal spray, Place into the nose., Disp: , Rfl:  .  gabapentin (NEURONTIN) 100 MG capsule, Take 300-400 mg by mouth See admin instructions.  in am,   in pm, Disp: , Rfl:  .  glipiZIDE (GLUCOTROL) 5 MG tablet, Take 10 mg by mouth 3 (three) times daily. , Disp: , Rfl:  .  glucose blood (EXACTECH TEST) test strip, 1 each by Other route as needed for Other. Precision xtra, Disp: , Rfl:  .  lisinopril (PRINIVIL,ZESTRIL) 40 MG tablet, Take 40 mg by mouth daily., Disp: , Rfl:  .  methocarbamol (ROBAXIN) 500 MG tablet, Take 500 mg by mouth every 8 (eight) hours as needed for muscle spasms. , Disp: , Rfl:  .  naproxen (NAPROSYN) 375 MG tablet, Take by mouth., Disp: , Rfl:  .  nitroGLYCERIN (NITROSTAT) 0.4 MG SL tablet, Place 0.4 mg under the tongue every 5 (five) minutes as needed for chest pain. , Disp: , Rfl:  .  Omega-3 Fatty Acids (FISH OIL) 1000 MG CAPS, Take 3 capsules by mouth 2 (two) times daily. ,  Disp: , Rfl:  .  pravastatin (PRAVACHOL) 40 MG tablet, Take 40 mg by mouth daily., Disp: , Rfl:  .  propranolol (INDERAL) 20 MG tablet, Take 40 mg by mouth 3 (three) times daily. , Disp: , Rfl:  .  sodium chloride (OCEAN) 0.65 % SOLN nasal spray, Place 1 spray into both nostrils as needed for congestion., Disp: , Rfl:  .  triamcinolone cream (KENALOG) 0.1 %, Apply topically., Disp: , Rfl:   Allergies  Allergen Reactions  . Codeine Other (See Comments)    hallucinations  . Sulfonamide Derivatives Swelling    Throat swelling   Review of Systems: Negative except as noted in the HPI. Denies N/V/F/Ch. Objective:   Vitals:   10/03/17 0946  BP: (!) 150/71  Pulse: (!) 57  Resp: 16   General AA&O x3. Normal mood and affect.  Vascular Dorsalis pedis and posterior tibial pulses  present 2+ bilaterally  Capillary refill normal to all digits. Pedal hair growth normal.  Neurologic Epicritic sensation grossly present.  Dermatologic No open lesions. Interspaces clear of maceration. Nails well groomed and normal in appearance but for R great toenail medial border which is ingrowing with slight erythema and pain to palpation.  Orthopedic: MMT 5/5 in dorsiflexion, plantarflexion, inversion, and eversion. Normal joint ROM without pain or crepitus. Pain to palpation peroneal tendons about the lateral malleolus.   Assessment & Plan:  Patient was evaluated and treated and all questions answered.  Ingrown Nail, right -Patient elects to proceed with ingrown toenail removal today -Ingrown nail excised. See procedure note. -Educated on post-procedure care including soaking. Written instructions provided. -Patient to follow up in 2 weeks for nail check. -Discussed possible delayed healing due to DM. Attempt made to minimize trauma during nail removal. No tourniquet used.  Procedure: Excision of Ingrown Toenail Location: Right 1st toe medial nail borders. Anesthesia: Lidocaine 1% plain; 1.31mL and  Marcaine 0.5% plain; 1.41mL, digital block. Skin Prep: Alcohol. Dressing: Silvadene; telfa; dry, sterile, compression dressing. Technique: Following skin prep, a nail splitter was gently used to excise a portion of the ingrown nail down to the nail base. Nail removed with a hemostat. Abx ointment and band-aid applied. Disposition: Patient tolerated procedure well. Patient to return in 2 weeks for follow-up.   Peroneal Tendonitis L -Rx Voltaren gel -Rx for Lace up ankle brace. Patient to get at the [place].  Return in about 2 weeks (around 10/17/2017) for Nail Check.

## 2017-10-09 ENCOUNTER — Telehealth: Payer: Self-pay | Admitting: Podiatry

## 2017-10-09 NOTE — Telephone Encounter (Signed)
This is Darrold Span calling from the [facility]. I'm calling to get the office visit notes on Mr. Phung from date of service 03 Oct 2017. If you could please fax those to my attention at [phone]. If you have any questions you can reach me at [phone] ext. [phone]. Thank you.

## 2017-10-17 ENCOUNTER — Ambulatory Visit: Payer: Non-veteran care | Admitting: Podiatry

## 2017-10-17 ENCOUNTER — Ambulatory Visit (INDEPENDENT_AMBULATORY_CARE_PROVIDER_SITE_OTHER): Payer: Medicare HMO | Admitting: Podiatry

## 2017-10-17 DIAGNOSIS — M779 Enthesopathy, unspecified: Secondary | ICD-10-CM | POA: Diagnosis not present

## 2017-10-17 DIAGNOSIS — L600 Ingrowing nail: Secondary | ICD-10-CM | POA: Diagnosis not present

## 2017-10-17 DIAGNOSIS — M7752 Other enthesopathy of left foot: Secondary | ICD-10-CM

## 2017-10-17 MED ORDER — DICLOFENAC SODIUM 1 % TD GEL: 2.0000 g | Freq: Four times a day (QID) | TRANSDERMAL | 1 refills | Status: DC

## 2017-10-17 NOTE — Progress Notes (Signed)
  Subjective:  Patient ID: Shane Peterson, male    DOB: 10-28-1945,  MRN: [id number]  Chief Complaint  Patient presents with  . Nail Problem    2 week nail check   72 y.o. male returns for the above complaint. Doing well. Toe doesn't hurt but does report some stiffness. Got his ankle brace but hasn't been using it much because he doesn't think it helps.  Objective:   General AA&O x3. Normal mood and affect.  Vascular Foot warm and well perfused with good capillary refill.  Neurologic Sensation grossly intact.  Dermatologic Nail avulsion site healing well without drainage or erythema. Nail bed with overlying soft crust. Left intact. No signs of local infection.  Orthopedic: No tenderness to palpation of the toe. TTP L peroneal tendons.   Assessment & Plan:  Patient was evaluated and treated and all questions answered.  S/p Ingrown Toenail Excision, right -Healing well without issue. -Discussed return precautions.  Peroneal Tendonitis L -Refill Voltaren -Discussed use of ankle brace.

## 2017-11-26 ENCOUNTER — Telehealth: Payer: Self-pay

## 2017-11-26 NOTE — Telephone Encounter (Signed)
SENT REFERRAL TO SCHEDULING AND FILED NOTES 

## 2017-11-26 NOTE — Telephone Encounter (Signed)
Sent referral to scheduling filed notes

## 2017-11-27 ENCOUNTER — Ambulatory Visit: Payer: Non-veteran care | Admitting: Podiatry

## 2017-12-22 ENCOUNTER — Ambulatory Visit: Payer: Medicare HMO | Admitting: Internal Medicine

## 2017-12-22 ENCOUNTER — Encounter: Payer: Self-pay | Admitting: Internal Medicine

## 2017-12-22 ENCOUNTER — Encounter (INDEPENDENT_AMBULATORY_CARE_PROVIDER_SITE_OTHER): Payer: Self-pay

## 2017-12-22 VITALS — BP 122/76 | HR 53 | Ht 68.0 in | Wt 192.4 lb

## 2017-12-22 DIAGNOSIS — I10 Essential (primary) hypertension: Secondary | ICD-10-CM

## 2017-12-22 DIAGNOSIS — I480 Paroxysmal atrial fibrillation: Secondary | ICD-10-CM | POA: Diagnosis not present

## 2017-12-22 NOTE — Progress Notes (Signed)
Electrophysiology Office Note   Date:  12/22/2017   ID:  Shane Peterson, DOB 05-22-1945, MRN [id number]  PCP:  [facility], [facility]  Cardiologist:  [facility] previously Dr Deborah Chalk Primary Electrophysiologist: Hillis Range, MD    CC: afib   History of Present Illness: Shane Peterson is a 72 y.o. male who presents today for electrophysiology evaluation.   He is referred by Dr Wanda Plump from the [facility] for EP consultation of AF.  He was diagnosed with atrial fibrillation in October when he was admitted to [facility] for ecoli sepsis. In retrospect, he has had palpitations for many years.  He reports wearing multiple event monitors which have been mostly unrevealing.   He has continued to have episodes of afib october.  He reports symptoms of palpitations and SOB.  He has been placed on propranolol and eliquis.  Episodes are short lived and mostly well tolerated. He remains active. Today, he denies symptoms of palpitations, chest pain, shortness of breath, orthopnea, PND, lower extremity edema, claudication, dizziness, presyncope, syncope, bleeding, or neurologic sequela. The patient is tolerating medications without difficulties and is otherwise without complaint today.    Past Medical History:  Diagnosis Date  . Atrial fibrillation (HCC)   . Coronary artery disease    s/p CABG  . Family history of adverse reaction to anesthesia    "daughter get sick" (03/10/2017)  . GERD (gastroesophageal reflux disease)   . HLD (hyperlipidemia)   . HTN (hypertension)   . IHD (ischemic heart disease)   . PONV (postoperative nausea and vomiting)   . PVC's (premature ventricular contractions)   . Type II diabetes mellitus (HCC)    Past Surgical History:  Procedure Laterality Date  . ANTERIOR CERVICAL DECOMP/DISCECTOMY FUSION  05/2000   Shane Peterson 10/02/2010  . APPENDECTOMY  1962  . BACK SURGERY    . CARDIAC CATHETERIZATION  07/2000; 07/2001, 08/2006   Shane Peterson 10/02/2010    . CORONARY ARTERY BYPASS GRAFT  07/2000   CABG X "5"  . LAPAROSCOPIC CHOLECYSTECTOMY  01/2009   Shane Peterson 02/24/2009  . POSTERIOR CERVICAL FUSION/FORAMINOTOMY  2001   foraminectomy/notes 10/02/2010  . TONSILLECTOMY       Current Outpatient Medications  Medication Sig Dispense Refill  . Alcohol Swabs (ALCOHOL PREP) PADS by Does not apply route.    . ALPRAZolam (XANAX) 0.5 MG tablet Take by mouth.    [name] amLODipine (NORVASC) 10 MG tablet Take 10 mg by mouth daily.    [name] apixaban (ELIQUIS) 5 MG TABS tablet Take 1 tablet (5 mg total) by mouth 2 (two) times daily. 60 tablet 0  . azelastine (ASTELIN) 0.1 % nasal spray Place 2 sprays into both nostrils 2 (two) times daily. Use in each nostril as directed    . carboxymethylcellulose (REFRESH PLUS) 0.5 % SOLN 1 drop 4 (four) times daily.    . cephALEXin (KEFLEX) 500 MG capsule Take 4 capsules by mouth as needed. Use as directed  0  . cetirizine (ZYRTEC) 10 MG tablet Take 10 mg by mouth at bedtime.     . Cholecalciferol (VITAMIN D) 2000 UNITS tablet Take 2,000 Units by mouth daily.    . cyanocobalamin (,VITAMIN B-12,) 1000 MCG/ML injection Inject 1,000 mcg into the muscle every 30 (thirty) days.    [name] esomeprazole (NEXIUM) 40 MG capsule Take 40 mg by mouth 2 (two) times daily before a meal.    . fluticasone (FLONASE) 50 MCG/ACT nasal spray Place 1 spray into both nostrils daily as  needed for allergies or rhinitis.    . fluticasone (VERAMYST) 27.5 MCG/SPRAY nasal spray Place into the nose.    . gabapentin (NEURONTIN) 100 MG capsule Take 300-400 mg by mouth See admin instructions. 300mg  in am, 400mg  in pm    . glipiZIDE (GLUCOTROL) 5 MG tablet Take 10 mg by mouth 3 (three) times daily.     [name] glucose blood (EXACTECH TEST) test strip 1 each by Other route as needed for Other. Precision xtra    . GUAIFENESIN PO Take 1 tablet by mouth 2 (two) times daily as needed for allergies.    [name] lisinopril (PRINIVIL,ZESTRIL) 40 MG tablet Take 40 mg by mouth daily.    [name]  METFORMIN HCL PO Take by mouth.    . methocarbamol (ROBAXIN) 500 MG tablet Take 500 mg by mouth every 8 (eight) hours as needed for muscle spasms.     . naproxen (NAPROSYN) 375 MG tablet Take by mouth.    . nitroGLYCERIN (NITROSTAT) 0.4 MG SL tablet Place 0.4 mg under the tongue every 5 (five) minutes as needed for chest pain.     . Omega-3 Fatty Acids (FISH OIL) 1000 MG CAPS Take 3 capsules by mouth 2 (two) times daily.     . pravastatin (PRAVACHOL) 40 MG tablet Take 40 mg by mouth daily.    . propranolol (INDERAL) 20 MG tablet Take 40 mg by mouth 3 (three) times daily.     . sodium chloride (OCEAN) 0.65 % SOLN nasal spray Place 1 spray into both nostrils as needed for congestion.    . triamcinolone cream (KENALOG) 0.1 % Apply topically.     No current facility-administered medications for this visit.     Allergies:   Codeine and Sulfonamide derivatives   Social History:  The patient  reports that he has never smoked. He has never used smokeless tobacco. He reports that he does not drink alcohol or use drugs.   Family History:  The patient's  family history includes Atrial fibrillation in his unknown relative; Cerebral palsy in his unknown relative; Heart attack in his unknown relative; Hypertension in his unknown relative.    ROS:  Please see the history of present illness.   All other systems are personally reviewed and negative.    PHYSICAL EXAM: VS:  BP 122/76   Pulse (!) 53   Ht 5\' 8"  (1.727 m)   Wt 192 lb 6.4 oz (87.3 kg)   SpO2 96%   BMI 29.25 kg/m  , BMI Body mass index is 29.25 kg/m. GEN: Well nourished, well developed, in no acute distress  HEENT: normal  Neck: no JVD, carotid bruits, or masses Cardiac: RRR; no murmurs, rubs, or gallops,no edema  Respiratory:  clear to auscultation bilaterally, normal work of breathing GI: soft, nontender, nondistended, + BS MS: no deformity or atrophy  Skin: warm and dry  Neuro:  Strength and sensation are intact Psych: euthymic  mood, full affect  EKG:  EKG is ordered today. The ekg ordered today is personally reviewed and shows sinus rhythm 53 bpm, PR 182 msec, QRS 98 msec, Qtc 414 msec   Recent Labs: 03/10/2017: ALT 53 03/12/2017: BUN 13; Creatinine, Ser 0.92; Potassium 4.4; Sodium 132 03/13/2017: Hemoglobin 14.2; Platelets 80  personally reviewed   Lipid Panel     Component Value Date/Time   CHOL  08/05/2010 1008    159        ATP III CLASSIFICATION:  <200     mg/dL   Desirable  200-239  mg/dL   Borderline High  >=161>=240    mg/dL   High          TRIG 84 08/05/2010 1008   HDL 30 (L) 08/05/2010 1008   CHOLHDL 5.3 08/05/2010 1008   VLDL 17 08/05/2010 1008   LDLCALC (H) 08/05/2010 1008    112        Total Cholesterol/HDL:CHD Risk Coronary Heart Disease Risk Table                     Men   Women  1/2 Average Risk   3.4   3.3  Average Risk       5.0   4.4  2 X Average Risk   9.6   7.1  3 X Average Risk  23.4   11.0        Use the calculated Patient Ratio above and the CHD Risk Table to determine the patient's CHD Risk.        ATP III CLASSIFICATION (LDL):  <100     mg/dL   Optimal  [phone]  mg/dL   Near or Above                    Optimal  130-159  mg/dL   Borderline  [phone]  mg/dL   High  >914>190     mg/dL   Very High   personally reviewed   Wt Readings from Last 3 Encounters:  12/22/17 192 lb 6.4 oz (87.3 kg)  03/12/17 187 lb 2.7 oz (84.9 kg)  09/15/14 192 lb 12.8 oz (87.5 kg)      Other studies personally reviewed: Additional studies/ records that were reviewed today include: [facility] [facility],  Hospital records from 10/18 reviewed,  Echo from 03/11/17 reviewed  Review of the above records today demonstrates: EF 60%, mild pulmonary hypertension, LA Volume 58 ml   ASSESSMENT AND PLAN:  1.  Paroxysmal atrial fibrillation/ palpitations The patient has symptomatic, recurrent paroxysmal atrial fibrillation.  Chads2vasc score is 4.  he is anticoagulated with  eliquis At this time, I would like to better characterize his palpitations.  I am not convinced that his AF burden is very high.   Before we commit him to AAD therapy or ablation, I have advised ILR placement for afib management.  I think that this could be very beneficial in managing his palpitations.  Risks and benefits of ILR were discussed at length with the patient who wishes to proceed.  [facility] would [facility] about [facility].  Stop ASA today.  2. HTN Stable No change required today  3. CAD S/p CABG No ischemic symptoms  Follow-up:  Return in 3 months  Current medicines are reviewed at length with the patient today.   The patient does not have concerns regarding his medicines.  The following changes were made today:  none  Labs/ tests ordered today include:  Orders Placed This Encounter  Procedures  . EKG 12-Lead     Signed, Hillis RangeJames Ameerah Huffstetler, MD  12/22/2017 11:41 AM     [facility] [address] [place] [address] [phone] (fax)

## 2017-12-22 NOTE — Patient Instructions (Addendum)
Medication Instructions:  Your physician has recommended you make the following change in your medication:  1.  Stop taking ASPIRIN  Labwork: None ordered.  Testing/Procedures: None ordered.  Follow-Up: Your physician wants you to follow-up in: 3 months with Dr. Johney FrameAllred.      Any Other Special Instructions Will Be Listed Below (If Applicable).  If you need a refill on your cardiac medications before your next appointment, please call your pharmacy.     Implantable Loop Recorder Placement An implantable loop recorder is a small electronic device that is placed under the skin of your chest. It is about the size of an AA ("double A") battery. The device records the electrical activity of your heart over a long period of time. Your health care provider can download these recordings to monitor your heart. You may need an implantable loop recorder if you have periods of abnormal heart activity (arrhythmias) or unexplained fainting (syncope) caused by a heart problem.  The CPT codes for an implantable loop recorder are:  33285, [id number] and [id number]

## 2018-01-08 ENCOUNTER — Telehealth: Payer: Self-pay

## 2018-01-08 NOTE — Telephone Encounter (Signed)
Unable to find Pt's cardiologist (Dr. Wanda PlumpHumphries?).  [facility] is Dr. Cleta Albertsorazon Mulles.  Sent office note to Pt's PCP and asked him to forward to Pt's cardiologist with [facility] if he has one.

## 2018-01-14 ENCOUNTER — Ambulatory Visit (INDEPENDENT_AMBULATORY_CARE_PROVIDER_SITE_OTHER): Payer: Medicare HMO | Admitting: Podiatry

## 2018-01-14 ENCOUNTER — Encounter: Payer: Self-pay | Admitting: Podiatry

## 2018-01-14 DIAGNOSIS — M79676 Pain in unspecified toe(s): Secondary | ICD-10-CM | POA: Diagnosis not present

## 2018-01-14 DIAGNOSIS — L600 Ingrowing nail: Secondary | ICD-10-CM | POA: Diagnosis not present

## 2018-01-14 NOTE — Progress Notes (Signed)
Subjective:  Patient ID: Shane Peterson, male    DOB: [birth year],  MRN: [id number]  Chief Complaint  Patient presents with  . Nail Problem    this right big toe does hurt some     72 y.o. male presents with the above complaint.  Reports pain in the right great toe.  Started Saturday night.  States that hurts when he is walking.  Endorses pain even from the bedsheet.  Could not follow-up from last visit.  Review authorization issue  Review of Systems: Negative except as noted in the HPI. Denies N/V/F/Ch.  Past Medical History:  Diagnosis Date  . Atrial fibrillation (HCC)   . Coronary artery disease    s/p CABG  . Family history of adverse reaction to anesthesia    "daughter get sick" (03/10/2017)  . GERD (gastroesophageal reflux disease)   . HLD (hyperlipidemia)   . HTN (hypertension)   . IHD (ischemic heart disease)   . PONV (postoperative nausea and vomiting)   . PVC's (premature ventricular contractions)   . Type II diabetes mellitus (HCC)     Current Outpatient Medications:  .  Alcohol Swabs (ALCOHOL PREP) PADS, by Does not apply route., Disp: , Rfl:  .  ALPRAZolam (XANAX) 0.5 MG tablet, Take by mouth., Disp: , Rfl:  .  amLODipine (NORVASC) 10 MG tablet, Take 10 mg by mouth daily., Disp: , Rfl:  .  apixaban (ELIQUIS) 5 MG TABS tablet, Take 1 tablet (5 mg total) by mouth 2 (two) times daily., Disp: 60 tablet, Rfl: 0 .  azelastine (ASTELIN) 0.1 % nasal spray, Place 2 sprays into both nostrils 2 (two) times daily. Use in each nostril as directed, Disp: , Rfl:  .  carboxymethylcellulose (REFRESH PLUS) 0.5 % SOLN, 1 drop 4 (four) times daily., Disp: , Rfl:  .  cephALEXin (KEFLEX) 500 MG capsule, Take 4 capsules by mouth as needed. Use as directed, Disp: , Rfl: 0 .  cetirizine (ZYRTEC) 10 MG tablet, Take 10 mg by mouth at bedtime. , Disp: , Rfl:  .  Cholecalciferol (VITAMIN D) 2000 UNITS tablet, Take 2,000 Units by mouth daily., Disp: , Rfl:  .  cyanocobalamin (,VITAMIN B-12,)  1000 MCG/ML injection, Inject 1,000 mcg into the muscle every 30 (thirty) days., Disp: , Rfl:  .  esomeprazole (NEXIUM) 40 MG capsule, Take 40 mg by mouth 2 (two) times daily before a meal., Disp: , Rfl:  .  fluticasone (FLONASE) 50 MCG/ACT nasal spray, Place 1 spray into both nostrils daily as needed for allergies or rhinitis., Disp: , Rfl:  .  fluticasone (VERAMYST) 27.5 MCG/SPRAY nasal spray, Place into the nose., Disp: , Rfl:  .  gabapentin (NEURONTIN) 100 MG capsule, Take 300-400 mg by mouth See admin instructions. 300mg  in am, 400mg  in pm, Disp: , Rfl:  .  glipiZIDE (GLUCOTROL) 5 MG tablet, Take 10 mg by mouth 3 (three) times daily. , Disp: , Rfl:  .  glucose blood (EXACTECH TEST) test strip, 1 each by Other route as needed for Other. Precision xtra, Disp: , Rfl:  .  GUAIFENESIN PO, Take 1 tablet by mouth 2 (two) times daily as needed for allergies., Disp: , Rfl:  .  lisinopril (PRINIVIL,ZESTRIL) 40 MG tablet, Take 40 mg by mouth daily., Disp: , Rfl:  .  METFORMIN HCL PO, Take by mouth., Disp: , Rfl:  .  methocarbamol (ROBAXIN) 500 MG tablet, Take 500 mg by mouth every 8 (eight) hours as needed for muscle spasms. , Disp: , Rfl:  .  naproxen (NAPROSYN) 375 MG tablet, Take by mouth., Disp: , Rfl:  .  nitroGLYCERIN (NITROSTAT) 0.4 MG SL tablet, Place 0.4 mg under the tongue every 5 (five) minutes as needed for chest pain. , Disp: , Rfl:  .  Omega-3 Fatty Acids (FISH OIL) 1000 MG CAPS, Take 3 capsules by mouth 2 (two) times daily. , Disp: , Rfl:  .  pravastatin (PRAVACHOL) 40 MG tablet, Take 40 mg by mouth daily., Disp: , Rfl:  .  propranolol (INDERAL) 20 MG tablet, Take 40 mg by mouth 3 (three) times daily. , Disp: , Rfl:  .  sodium chloride (OCEAN) 0.65 % SOLN nasal spray, Place 1 spray into both nostrils as needed for congestion., Disp: , Rfl:  .  triamcinolone cream (KENALOG) 0.1 %, Apply topically., Disp: , Rfl:   Social History   Tobacco Use  Smoking Status Never Smoker  Smokeless  Tobacco Never Used    Allergies  Allergen Reactions  . Codeine Other (See Comments)    hallucinations  . Sulfonamide Derivatives Swelling    Throat swelling   Objective:  There were no vitals filed for this visit. There is no height or weight on file to calculate BMI. Constitutional Well developed. Well nourished.  Vascular Dorsalis pedis pulses palpable bilaterally. Posterior tibial pulses palpable bilaterally. Capillary refill normal to all digits.  No cyanosis or clubbing noted. Pedal hair growth normal.  Neurologic Normal speech. Oriented to person, place, and time. Epicritic sensation to light touch grossly present bilaterally.  Dermatologic Nails well groomed and normal in appearance. No open wounds. No skin lesions.  Orthopedic: Normal joint ROM without pain or crepitus bilaterally. No visible deformities. No bony tenderness.   Radiographs: None Assessment:   1. Ingrown nail   2. Pain around toenail    Plan:  Patient was evaluated and treated and all questions answered.  Ingrown nail right -Nail gently debrided with curette to patient relief.  Educated on soaking of the nail.  Discussed the nail may further grow back and cause issues advised to follow-up should this occur.  No follow-ups on file.

## 2018-03-25 ENCOUNTER — Ambulatory Visit: Payer: Medicare HMO | Admitting: Internal Medicine

## 2018-03-25 ENCOUNTER — Encounter: Payer: Self-pay | Admitting: Internal Medicine

## 2018-03-25 VITALS — BP 122/70 | HR 53 | Ht 68.0 in | Wt 188.0 lb

## 2018-03-25 DIAGNOSIS — I480 Paroxysmal atrial fibrillation: Secondary | ICD-10-CM

## 2018-03-25 DIAGNOSIS — I10 Essential (primary) hypertension: Secondary | ICD-10-CM | POA: Diagnosis not present

## 2018-03-25 NOTE — Patient Instructions (Addendum)
Medication Instructions:  Your physician recommends that you continue on your current medications as directed. Please refer to the Current Medication list given to you today.  If you need a refill on your cardiac medications before your next appointment, please call your pharmacy.   Lab work: None Ordered  If you have labs (blood work) drawn today and your tests are completely normal, you will receive your results only by: [name] MyChart Message (if you have MyChart) OR . A paper copy in the mail If you have any lab test that is abnormal or we need to change your treatment, we will call you to review the results.  Testing/Procedures: None ordered  Follow-Up: . AS NEEDED in the Atrial Fibrillation Clinic   Any Other Special Instructions Will Be Listed Below (If Applicable).

## 2018-03-25 NOTE — Progress Notes (Signed)
PCP: [name], PA-C Primary Cardiologist: [facility]), previously Dr Deborah Chalk Primary EP: Dr Lattie Corns is a 72 y.o. male who presents today for routine electrophysiology followup.  Since last being seen in our clinic, the patient reports doing very well.  Denies sustained arrhythmias.  Very rare palpitations which are short lived.  Today, he denies symptoms of chest pain, shortness of breath,  lower extremity edema, dizziness, presyncope, or syncope.  The patient is otherwise without complaint today.   Past Medical History:  Diagnosis Date  . Atrial fibrillation (HCC)   . Coronary artery disease    s/p CABG  . Family history of adverse reaction to anesthesia    "daughter get sick" (03/10/2017)  . GERD (gastroesophageal reflux disease)   . HLD (hyperlipidemia)   . HTN (hypertension)   . IHD (ischemic heart disease)   . PONV (postoperative nausea and vomiting)   . PVC's (premature ventricular contractions)   . Type II diabetes mellitus (HCC)    Past Surgical History:  Procedure Laterality Date  . ANTERIOR CERVICAL DECOMP/DISCECTOMY FUSION  05/2000   [name] 10/02/2010  . APPENDECTOMY  1962  . BACK SURGERY    . CARDIAC CATHETERIZATION  07/2000; 07/2001, 08/2006   [name] 10/02/2010  . CORONARY ARTERY BYPASS GRAFT  07/2000   CABG X "5"  . LAPAROSCOPIC CHOLECYSTECTOMY  01/2009   [name] 02/24/2009  . POSTERIOR CERVICAL FUSION/FORAMINOTOMY  2001   foraminectomy/notes 10/02/2010  . TONSILLECTOMY      ROS- all systems are reviewed and negatives except as per HPI above  Current Outpatient Medications  Medication Sig Dispense Refill  . Alcohol Swabs (ALCOHOL PREP) PADS by Does not apply route.    . ALPRAZolam (XANAX) 0.5 MG tablet Take by mouth.    [name] amLODipine (NORVASC) 10 MG tablet Take 10 mg by mouth daily.    [name] apixaban (ELIQUIS) 5 MG TABS tablet Take 1 tablet (5 mg total) by mouth 2 (two) times daily. 60 tablet 0  . azelastine (ASTELIN) 0.1 %  nasal spray Place 2 sprays into both nostrils 2 (two) times daily. Use in each nostril as directed    . carboxymethylcellulose (REFRESH PLUS) 0.5 % SOLN 1 drop 4 (four) times daily.    . cephALEXin (KEFLEX) 500 MG capsule Take 4 capsules by mouth as needed. Use as directed  0  . cetirizine (ZYRTEC) 10 MG tablet Take 10 mg by mouth at bedtime.     . Cholecalciferol (VITAMIN D) 2000 UNITS tablet Take 2,000 Units by mouth daily.    . cyanocobalamin (,VITAMIN B-12,) 1000 MCG/ML injection Inject 1,000 mcg into the muscle every 30 (thirty) days.    [name] esomeprazole (NEXIUM) 40 MG capsule Take 40 mg by mouth 2 (two) times daily before a meal.    . fluticasone (FLONASE) 50 MCG/ACT nasal spray Place 1 spray into both nostrils daily as needed for allergies or rhinitis.    . fluticasone (VERAMYST) 27.5 MCG/SPRAY nasal spray Place into the nose.    . gabapentin (NEURONTIN) 100 MG capsule Take 300-400 mg by mouth See admin instructions. 300mg  in am, 400mg  in pm    . glipiZIDE (GLUCOTROL) 5 MG tablet Take 10 mg by mouth 3 (three) times daily.     [name] glucose blood (EXACTECH TEST) test strip 1 each by Other route as needed for Other. Precision xtra    . GUAIFENESIN PO Take 1 tablet by mouth 2 (two) times daily as needed for allergies.    [name]  lisinopril (PRINIVIL,ZESTRIL) 40 MG tablet Take 40 mg by mouth daily.    [name] METFORMIN HCL PO Take by mouth.    . methocarbamol (ROBAXIN) 500 MG tablet Take 500 mg by mouth every 8 (eight) hours as needed for muscle spasms.     . naproxen (NAPROSYN) 375 MG tablet Take by mouth.    . nitroGLYCERIN (NITROSTAT) 0.4 MG SL tablet Place 0.4 mg under the tongue every 5 (five) minutes as needed for chest pain.     . Omega-3 Fatty Acids (FISH OIL) 1000 MG CAPS Take 3 capsules by mouth 2 (two) times daily.     . pravastatin (PRAVACHOL) 40 MG tablet Take 40 mg by mouth daily.    . propranolol (INDERAL) 20 MG tablet Take 40 mg by mouth 3 (three) times daily.     . sodium chloride (OCEAN) 0.65  % SOLN nasal spray Place 1 spray into both nostrils as needed for congestion.    . triamcinolone cream (KENALOG) 0.1 % Apply topically.     No current facility-administered medications for this visit.     Physical Exam: Vitals:   03/25/18 1500  BP: 122/70  Pulse: (!) 53  SpO2: 97%  Weight: 188 lb (85.3 kg)  Height: 5\' 8"  (1.727 m)    GEN- The patient is well appearing, alert and oriented x 3 today.   Head- normocephalic, atraumatic Eyes-  Sclera clear, conjunctiva pink Ears- hearing intact Oropharynx- clear Lungs- Clear to ausculation bilaterally, normal work of breathing Heart- Regular rate and rhythm, no murmurs, rubs or gallops, PMI not laterally displaced GI- soft, NT, ND, + BS Extremities- no clubbing, cyanosis, or edema  Wt Readings from Last 3 Encounters:  03/25/18 188 lb (85.3 kg)  12/22/17 192 lb 6.4 oz (87.3 kg)  03/12/17 187 lb 2.7 oz (84.9 kg)    EKG tracing ordered today is personally reviewed and shows sinus rhythm 53 bpm, PR 168 msec, QRS 100 smec, QTc 403 msec  Assessment and Plan:  1. Paroxysmal atrial fibrillation/ palpitations chads2vasc score is 4.  He is on eliquis. He has not tried AAD therapy. We have discussed ILR previously prior to consideration of medical or ablative therapy as I have felt that his AF burden is low. He wishes to continue a more conservative approach at this time.  2. HTN Stable No change required today  3. CAD S/p CABG No ischemic symptoms  Follow-up with his [facility] cardiology team as scheduled I will see when needed  Hillis Range MD, [facility] 03/25/2018 3:10 PM

## 2018-04-09 ENCOUNTER — Ambulatory Visit (INDEPENDENT_AMBULATORY_CARE_PROVIDER_SITE_OTHER): Payer: No Typology Code available for payment source | Admitting: Podiatry

## 2018-04-09 DIAGNOSIS — M792 Neuralgia and neuritis, unspecified: Secondary | ICD-10-CM

## 2018-04-09 DIAGNOSIS — E1142 Type 2 diabetes mellitus with diabetic polyneuropathy: Secondary | ICD-10-CM | POA: Diagnosis not present

## 2018-04-09 DIAGNOSIS — M2042 Other hammer toe(s) (acquired), left foot: Secondary | ICD-10-CM

## 2018-04-09 DIAGNOSIS — M2041 Other hammer toe(s) (acquired), right foot: Secondary | ICD-10-CM | POA: Diagnosis not present

## 2018-04-09 DIAGNOSIS — M2011 Hallux valgus (acquired), right foot: Secondary | ICD-10-CM | POA: Diagnosis not present

## 2018-04-09 DIAGNOSIS — M2012 Hallux valgus (acquired), left foot: Secondary | ICD-10-CM

## 2018-04-10 ENCOUNTER — Telehealth: Payer: Self-pay | Admitting: *Deleted

## 2018-04-10 DIAGNOSIS — E1142 Type 2 diabetes mellitus with diabetic polyneuropathy: Secondary | ICD-10-CM

## 2018-04-10 NOTE — Telephone Encounter (Signed)
Hand delivered referral to [facility]. 

## 2018-04-12 NOTE — Progress Notes (Signed)
Subjective:  Patient ID: Shane Peterson, male    DOB: March 12, 1946,  MRN: [id number]  Chief Complaint  Patient presents with  . Diabetes    last A1C 7.2  . Peripheral Neuropathy    worsening bilateral throbbing burning pain in the bottom of feet    72 y.o. male presents with the above complaint.  Still having significant worsening neuropathy with burning pain to the bottom of Peterson feet. Review of Systems: Negative except as noted in the HPI. Denies N/V/F/Ch.  Past Medical History:  Diagnosis Date  . Atrial fibrillation (HCC)   . Coronary artery disease    s/p CABG  . Family history of adverse reaction to anesthesia    "daughter get sick" (03/10/2017)  . GERD (gastroesophageal reflux disease)   . HLD (hyperlipidemia)   . HTN (hypertension)   . IHD (ischemic heart disease)   . PONV (postoperative nausea and vomiting)   . PVC's (premature ventricular contractions)   . Type II diabetes mellitus (HCC)     Current Outpatient Medications:  .  Alcohol Swabs (ALCOHOL PREP) PADS, by Does not apply route., Disp: , Rfl:  .  ALPRAZolam (XANAX) 0.5 MG tablet, Take by mouth., Disp: , Rfl:  .  amLODipine (NORVASC) 10 MG tablet, Take 10 mg by mouth daily., Disp: , Rfl:  .  amoxicillin (AMOXIL) 875 MG tablet, TAKE 1 TABLET TWICE DAILY. TAKE WITH FOOD., Disp: , Rfl: 0 .  apixaban (ELIQUIS) 5 MG TABS tablet, Take 1 tablet (5 mg total) by mouth 2 (two) times daily., Disp: 60 tablet, Rfl: 0 .  azelastine (ASTELIN) 0.1 % nasal spray, Place 2 sprays into Peterson nostrils 2 (two) times daily. Use in each nostril as directed, Disp: , Rfl:  .  carboxymethylcellulose (REFRESH PLUS) 0.5 % SOLN, 1 drop 4 (four) times daily., Disp: , Rfl:  .  cephALEXin (KEFLEX) 500 MG capsule, Take 4 capsules by mouth as needed. Use as directed, Disp: , Rfl: 0 .  cetirizine (ZYRTEC) 10 MG tablet, Take 10 mg by mouth at bedtime. , Disp: , Rfl:  .  Cholecalciferol (VITAMIN D) 2000 UNITS tablet, Take 2,000 Units by mouth  daily., Disp: , Rfl:  .  cyanocobalamin (,VITAMIN B-12,) 1000 MCG/ML injection, Inject 1,000 mcg into the muscle every 30 (thirty) days., Disp: , Rfl:  .  esomeprazole (NEXIUM) 40 MG capsule, Take 40 mg by mouth 2 (two) times daily before a meal., Disp: , Rfl:  .  fluticasone (FLONASE) 50 MCG/ACT nasal spray, Place 1 spray into Peterson nostrils daily as needed for allergies or rhinitis., Disp: , Rfl:  .  fluticasone (VERAMYST) 27.5 MCG/SPRAY nasal spray, Place into the nose., Disp: , Rfl:  .  gabapentin (NEURONTIN) 100 MG capsule, Take 300-400 mg by mouth See admin instructions. 300mg  in am, 400mg  in pm, Disp: , Rfl:  .  glipiZIDE (GLUCOTROL) 5 MG tablet, Take 10 mg by mouth 3 (three) times daily. , Disp: , Rfl:  .  glucose blood (EXACTECH TEST) test strip, 1 each by Other route as needed for Other. Precision xtra, Disp: , Rfl:  .  GUAIFENESIN PO, Take 1 tablet by mouth 2 (two) times daily as needed for allergies., Disp: , Rfl:  .  lisinopril (PRINIVIL,ZESTRIL) 40 MG tablet, Take 40 mg by mouth daily., Disp: , Rfl:  .  METFORMIN HCL PO, Take by mouth., Disp: , Rfl:  .  methocarbamol (ROBAXIN) 500 MG tablet, Take 500 mg by mouth every 8 (eight) hours as needed for  muscle spasms. , Disp: , Rfl:  .  naproxen (NAPROSYN) 375 MG tablet, Take by mouth., Disp: , Rfl:  .  nitroGLYCERIN (NITROSTAT) 0.4 MG SL tablet, Place 0.4 mg under the tongue every 5 (five) minutes as needed for chest pain. , Disp: , Rfl:  .  Omega-3 Fatty Acids (FISH OIL) 1000 MG CAPS, Take 3 capsules by mouth 2 (two) times daily. , Disp: , Rfl:  .  pravastatin (PRAVACHOL) 40 MG tablet, Take 40 mg by mouth daily., Disp: , Rfl:  .  propranolol (INDERAL) 20 MG tablet, Take 40 mg by mouth 3 (three) times daily. , Disp: , Rfl:  .  sodium chloride (OCEAN) 0.65 % SOLN nasal spray, Place 1 spray into Peterson nostrils as needed for congestion., Disp: , Rfl:  .  triamcinolone cream (KENALOG) 0.1 %, Apply topically., Disp: , Rfl:   Social History    Tobacco Use  Smoking Status Never Smoker  Smokeless Tobacco Never Used    Allergies  Allergen Reactions  . Codeine Other (See Comments)    hallucinations  . Sulfonamide Derivatives Swelling    Throat swelling   Objective:  There were no vitals filed for this visit. There is no height or weight on file to calculate BMI. Constitutional Well developed. Well nourished.  Vascular Dorsalis pedis pulses palpable bilaterally. Posterior tibial pulses palpable bilaterally. Capillary refill normal to all digits.  No cyanosis or clubbing noted. Pedal hair growth normal.  Neurologic Normal speech. Oriented to person, place, and time. Epicritic sensation to light touch grossly present bilaterally.  Dermatologic Nails well groomed and normal in appearance. No open wounds. No skin lesions.  Orthopedic: Normal joint ROM without pain or crepitus bilaterally. No visible deformities. No bony tenderness.   Radiographs: None Assessment:   1. DM type 2 with diabetic peripheral neuropathy (HCC)   2. Hammer toes of Peterson feet   3. Acquired hallux valgus of Peterson feet    Plan:  Patient was evaluated and treated and all questions answered.  DM with hammertoes, HAV -Refer for diabetic shoes  Painful diabetic neuropathy, recalcitrant -Educated on etiology -Refer to PT for neurogenic's   Return in about 2 months (around 06/09/2018) for Neuropathy.

## 2018-04-21 ENCOUNTER — Ambulatory Visit: Payer: Non-veteran care | Admitting: Orthotics

## 2018-04-21 DIAGNOSIS — M2011 Hallux valgus (acquired), right foot: Secondary | ICD-10-CM

## 2018-04-21 DIAGNOSIS — M7752 Other enthesopathy of left foot: Secondary | ICD-10-CM

## 2018-04-21 DIAGNOSIS — E1142 Type 2 diabetes mellitus with diabetic polyneuropathy: Secondary | ICD-10-CM

## 2018-04-21 DIAGNOSIS — M2042 Other hammer toe(s) (acquired), left foot: Principal | ICD-10-CM

## 2018-04-21 DIAGNOSIS — M2012 Hallux valgus (acquired), left foot: Secondary | ICD-10-CM

## 2018-04-21 DIAGNOSIS — M2041 Other hammer toe(s) (acquired), right foot: Secondary | ICD-10-CM

## 2018-04-21 NOTE — Progress Notes (Signed)
Going to try to get [facility] approval for shoes/inserts

## 2018-04-22 ENCOUNTER — Telehealth: Payer: Self-pay | Admitting: Podiatry

## 2018-04-22 ENCOUNTER — Other Ambulatory Visit: Payer: Self-pay | Admitting: Podiatry

## 2018-04-22 DIAGNOSIS — M2012 Hallux valgus (acquired), left foot: Secondary | ICD-10-CM

## 2018-04-22 DIAGNOSIS — M2042 Other hammer toe(s) (acquired), left foot: Secondary | ICD-10-CM

## 2018-04-22 DIAGNOSIS — E1142 Type 2 diabetes mellitus with diabetic polyneuropathy: Secondary | ICD-10-CM

## 2018-04-22 DIAGNOSIS — M2041 Other hammer toe(s) (acquired), right foot: Secondary | ICD-10-CM

## 2018-04-22 DIAGNOSIS — M2011 Hallux valgus (acquired), right foot: Secondary | ICD-10-CM

## 2018-04-22 NOTE — Telephone Encounter (Signed)
[facility] because [facility] has not been [facility] through the [facility]

## 2018-04-22 NOTE — Progress Notes (Signed)
Rx placed for DM shoes

## 2018-05-26 ENCOUNTER — Ambulatory Visit: Payer: Self-pay | Admitting: Orthotics

## 2018-05-26 DIAGNOSIS — E1142 Type 2 diabetes mellitus with diabetic polyneuropathy: Secondary | ICD-10-CM

## 2018-05-26 DIAGNOSIS — M2012 Hallux valgus (acquired), left foot: Secondary | ICD-10-CM

## 2018-05-26 DIAGNOSIS — M2011 Hallux valgus (acquired), right foot: Secondary | ICD-10-CM

## 2018-05-26 DIAGNOSIS — M2042 Other hammer toe(s) (acquired), left foot: Secondary | ICD-10-CM

## 2018-05-26 DIAGNOSIS — M2041 Other hammer toe(s) (acquired), right foot: Secondary | ICD-10-CM

## 2018-05-26 DIAGNOSIS — M7752 Other enthesopathy of left foot: Secondary | ICD-10-CM

## 2018-05-26 DIAGNOSIS — M79676 Pain in unspecified toe(s): Secondary | ICD-10-CM

## 2018-05-26 NOTE — Progress Notes (Signed)

## 2018-06-11 ENCOUNTER — Ambulatory Visit (INDEPENDENT_AMBULATORY_CARE_PROVIDER_SITE_OTHER): Payer: No Typology Code available for payment source | Admitting: Podiatry

## 2018-06-11 DIAGNOSIS — M2041 Other hammer toe(s) (acquired), right foot: Secondary | ICD-10-CM

## 2018-06-11 DIAGNOSIS — M2042 Other hammer toe(s) (acquired), left foot: Secondary | ICD-10-CM

## 2018-06-11 DIAGNOSIS — M2012 Hallux valgus (acquired), left foot: Secondary | ICD-10-CM

## 2018-06-11 DIAGNOSIS — M2011 Hallux valgus (acquired), right foot: Secondary | ICD-10-CM | POA: Diagnosis not present

## 2018-06-11 DIAGNOSIS — M792 Neuralgia and neuritis, unspecified: Secondary | ICD-10-CM

## 2018-06-11 DIAGNOSIS — E1142 Type 2 diabetes mellitus with diabetic polyneuropathy: Secondary | ICD-10-CM

## 2018-06-11 NOTE — Progress Notes (Signed)
Subjective:  Patient ID: Shane Peterson, male    DOB: Apr 04, 1946,  MRN: [id number]  Chief Complaint  Patient presents with  . Peripheral Neuropathy    2  month follow up neuropathy - doing better over all - [facility] never approved PT    73 y.o. male presents with the above complaint. Doing much better states he got his insert and they have helped him.  Review of Systems: Negative except as noted in the HPI. Denies N/V/F/Ch.  Past Medical History:  Diagnosis Date  . Atrial fibrillation (HCC)   . Coronary artery disease    s/p CABG  . Family history of adverse reaction to anesthesia    "daughter get sick" (03/10/2017)  . GERD (gastroesophageal reflux disease)   . HLD (hyperlipidemia)   . HTN (hypertension)   . IHD (ischemic heart disease)   . PONV (postoperative nausea and vomiting)   . PVC's (premature ventricular contractions)   . Type II diabetes mellitus (HCC)     Current Outpatient Medications:  .  Alcohol Swabs (ALCOHOL PREP) PADS, by Does not apply route., Disp: , Rfl:  .  ALPRAZolam (XANAX) 0.5 MG tablet, Take by mouth., Disp: , Rfl:  .  amLODipine (NORVASC) 10 MG tablet, Take 10 mg by mouth daily., Disp: , Rfl:  .  amoxicillin (AMOXIL) 875 MG tablet, TAKE 1 TABLET TWICE DAILY. TAKE WITH FOOD., Disp: , Rfl: 0 .  apixaban (ELIQUIS) 5 MG TABS tablet, Take 1 tablet (5 mg total) by mouth 2 (two) times daily., Disp: 60 tablet, Rfl: 0 .  azelastine (ASTELIN) 0.1 % nasal spray, Place 2 sprays into both nostrils 2 (two) times daily. Use in each nostril as directed, Disp: , Rfl:  .  carboxymethylcellulose (REFRESH PLUS) 0.5 % SOLN, 1 drop 4 (four) times daily., Disp: , Rfl:  .  cephALEXin (KEFLEX) 500 MG capsule, Take 4 capsules by mouth as needed. Use as directed, Disp: , Rfl: 0 .  cetirizine (ZYRTEC) 10 MG tablet, Take 10 mg by mouth at bedtime. , Disp: , Rfl:  .  Cholecalciferol (VITAMIN D) 2000 UNITS tablet, Take 2,000 Units by mouth daily., Disp: , Rfl:  .  cyanocobalamin  (,VITAMIN B-12,) 1000 MCG/ML injection, Inject 1,000 mcg into the muscle every 30 (thirty) days., Disp: , Rfl:  .  esomeprazole (NEXIUM) 40 MG capsule, Take 40 mg by mouth 2 (two) times daily before a meal., Disp: , Rfl:  .  fluticasone (FLONASE) 50 MCG/ACT nasal spray, Place 1 spray into both nostrils daily as needed for allergies or rhinitis., Disp: , Rfl:  .  fluticasone (VERAMYST) 27.5 MCG/SPRAY nasal spray, Place into the nose., Disp: , Rfl:  .  gabapentin (NEURONTIN) 100 MG capsule, Take 300-400 mg by mouth See admin instructions. 300mg  in am, 400mg  in pm, Disp: , Rfl:  .  glipiZIDE (GLUCOTROL) 5 MG tablet, Take 10 mg by mouth 3 (three) times daily. , Disp: , Rfl:  .  glucose blood (EXACTECH TEST) test strip, 1 each by Other route as needed for Other. Precision xtra, Disp: , Rfl:  .  GUAIFENESIN PO, Take 1 tablet by mouth 2 (two) times daily as needed for allergies., Disp: , Rfl:  .  lisinopril (PRINIVIL,ZESTRIL) 40 MG tablet, Take 40 mg by mouth daily., Disp: , Rfl:  .  METFORMIN HCL PO, Take by mouth., Disp: , Rfl:  .  methocarbamol (ROBAXIN) 500 MG tablet, Take 500 mg by mouth every 8 (eight) hours as needed for muscle spasms. , Disp: ,  Rfl:  .  naproxen (NAPROSYN) 375 MG tablet, Take by mouth., Disp: , Rfl:  .  nitroGLYCERIN (NITROSTAT) 0.4 MG SL tablet, Place 0.4 mg under the tongue every 5 (five) minutes as needed for chest pain. , Disp: , Rfl:  .  Omega-3 Fatty Acids (FISH OIL) 1000 MG CAPS, Take 3 capsules by mouth 2 (two) times daily. , Disp: , Rfl:  .  pravastatin (PRAVACHOL) 40 MG tablet, Take 40 mg by mouth daily., Disp: , Rfl:  .  propranolol (INDERAL) 20 MG tablet, Take 40 mg by mouth 3 (three) times daily. , Disp: , Rfl:  .  sodium chloride (OCEAN) 0.65 % SOLN nasal spray, Place 1 spray into both nostrils as needed for congestion., Disp: , Rfl:  .  triamcinolone cream (KENALOG) 0.1 %, Apply topically., Disp: , Rfl:   Social History   Tobacco Use  Smoking Status Never Smoker   Smokeless Tobacco Never Used    Allergies  Allergen Reactions  . Codeine Other (See Comments)    hallucinations  . Sulfonamide Derivatives Swelling    Throat swelling   Objective:  There were no vitals filed for this visit. There is no height or weight on file to calculate BMI. Constitutional Well developed. Well nourished.  Vascular Dorsalis pedis pulses palpable bilaterally. Posterior tibial pulses palpable bilaterally. Capillary refill normal to all digits.  No cyanosis or clubbing noted. Pedal hair growth normal.  Neurologic Normal speech. Oriented to person, place, and time. Epicritic sensation to light touch grossly present bilaterally.  Dermatologic Nails well groomed and normal in appearance. No open wounds. No skin lesions.  Orthopedic: Normal joint ROM without pain or crepitus bilaterally. HAV, hammertoes bilat No bony tenderness.   Radiographs: None Assessment:   1. DM type 2 with diabetic peripheral neuropathy (HCC)   2. Acquired hallux valgus of both feet   3. Hammer toes of both feet   4. Neuralgia and neuritis    Plan:  Patient was evaluated and treated and all questions answered.  DM with hammertoes, HAV -Refer for additional medically necessary diabetic shoes and inserts.  Painful diabetic neuropathy, recalcitrant -Pending auth with PT for [employer].   Return if symptoms worsen or fail to improve.

## 2018-07-07 ENCOUNTER — Ambulatory Visit: Payer: Self-pay | Admitting: Orthotics

## 2018-07-07 DIAGNOSIS — M2041 Other hammer toe(s) (acquired), right foot: Secondary | ICD-10-CM

## 2018-07-07 DIAGNOSIS — M2042 Other hammer toe(s) (acquired), left foot: Secondary | ICD-10-CM

## 2018-07-07 DIAGNOSIS — M2012 Hallux valgus (acquired), left foot: Principal | ICD-10-CM

## 2018-07-07 DIAGNOSIS — E1142 Type 2 diabetes mellitus with diabetic polyneuropathy: Secondary | ICD-10-CM

## 2018-07-07 DIAGNOSIS — M2011 Hallux valgus (acquired), right foot: Secondary | ICD-10-CM

## 2018-07-07 NOTE — Progress Notes (Signed)
Patient picked out ortho boat shoe, f/o reordered from [employer]

## 2018-07-21 ENCOUNTER — Encounter: Payer: Self-pay | Admitting: Gastroenterology

## 2018-07-30 ENCOUNTER — Ambulatory Visit: Payer: Non-veteran care | Admitting: Orthotics

## 2018-07-30 ENCOUNTER — Other Ambulatory Visit: Payer: Self-pay

## 2018-07-30 DIAGNOSIS — M792 Neuralgia and neuritis, unspecified: Secondary | ICD-10-CM

## 2018-07-30 DIAGNOSIS — M2042 Other hammer toe(s) (acquired), left foot: Principal | ICD-10-CM

## 2018-07-30 DIAGNOSIS — M2041 Other hammer toe(s) (acquired), right foot: Secondary | ICD-10-CM

## 2018-07-30 DIAGNOSIS — E1142 Type 2 diabetes mellitus with diabetic polyneuropathy: Secondary | ICD-10-CM

## 2018-07-30 NOTE — Progress Notes (Signed)
Picked  Up second pair of [facility]; was well pleased.

## 2018-08-12 ENCOUNTER — Other Ambulatory Visit: Payer: Non-veteran care | Admitting: Orthotics

## 2021-05-29 ENCOUNTER — Encounter: Payer: Self-pay | Admitting: *Deleted

## 2021-05-29 ENCOUNTER — Other Ambulatory Visit: Payer: Self-pay

## 2021-05-29 ENCOUNTER — Encounter: Payer: Self-pay | Admitting: Cardiology

## 2021-05-29 ENCOUNTER — Ambulatory Visit (INDEPENDENT_AMBULATORY_CARE_PROVIDER_SITE_OTHER): Payer: No Typology Code available for payment source | Admitting: Cardiology

## 2021-05-29 VITALS — BP 118/64 | HR 57 | Ht 68.0 in | Wt 194.6 lb

## 2021-05-29 DIAGNOSIS — I2510 Atherosclerotic heart disease of native coronary artery without angina pectoris: Secondary | ICD-10-CM

## 2021-05-29 DIAGNOSIS — I10 Essential (primary) hypertension: Secondary | ICD-10-CM | POA: Diagnosis not present

## 2021-05-29 DIAGNOSIS — Z01818 Encounter for other preprocedural examination: Secondary | ICD-10-CM | POA: Diagnosis not present

## 2021-05-29 DIAGNOSIS — I480 Paroxysmal atrial fibrillation: Secondary | ICD-10-CM | POA: Diagnosis not present

## 2021-05-29 NOTE — Patient Instructions (Signed)
Medication Instructions:  °Your physician recommends that you continue on your current medications as directed. Please refer to the Current Medication list given to you today. °*If you need a refill on your cardiac medications before your next appointment, please call your pharmacy* ° °Lab Work: °None. °If you have labs (blood work) drawn today and your tests are completely normal, you will receive your results only by: °MyChart Message (if you have MyChart) OR °A paper copy in the mail °If you have any lab test that is abnormal or we need to change your treatment, we will call you to review the results. ° °Testing/Procedures: °Your physician has requested that you have cardiac CT. Cardiac computed tomography (CT) is a painless test that uses an x-ray machine to take clear, detailed pictures of your heart. For further information please visit www.cardiosmart.org. Please follow instruction sheet as given. °  °Your physician has recommended that you have an ablation. Catheter ablation is a medical procedure used to treat some cardiac arrhythmias (irregular heartbeats). During catheter ablation, a long, thin, flexible tube is put into a blood vessel in your groin (upper thigh), or neck. This tube is called an ablation catheter. It is then guided to your heart through the blood vessel. Radio frequency waves destroy small areas of heart tissue where abnormal heartbeats may cause an arrhythmia to start. Please see the instruction sheet given to you today. ° ° °Follow-Up: °At [facility], you and your health needs are our priority.  As part of our continuing mission to provide you with exceptional heart care, we have created designated Provider Care Teams.  These Care Teams include your primary Cardiologist (physician) and Advanced Practice Providers (APPs -  Physician Assistants and Nurse Practitioners) who all work together to provide you with the care you need, when you need it. ° °Your physician wants you to  follow-up in: see instruction letter.  ° °We recommend signing up for the patient portal called "MyChart".  Sign up information is provided on this After Visit Summary.  MyChart is used to connect with patients for Virtual Visits (Telemedicine).  Patients are able to view lab/test results, encounter notes, upcoming appointments, etc.  Non-urgent messages can be sent to your provider as well.   °To learn more about what you can do with MyChart, go to https://www.mychart.com.   ° °Any Other Special Instructions Will Be Listed Below (If Applicable). ° °Cardiac Ablation °Cardiac ablation is a procedure to destroy (ablate) some heart tissue that is sending bad signals. These bad signals cause problems in heart rhythm. °The heart has many areas that make these signals. If there are problems in these areas, they can make the heart beat in a way that is not normal. Destroying some tissues can help make the heart rhythm normal. °Tell your doctor about: °Any allergies you have. °All medicines you are taking. These include vitamins, herbs, eye drops, creams, and over-the-counter medicines. °Any problems you or family members have had with medicines that make you fall asleep (anesthetics). °Any blood disorders you have. °Any surgeries you have had. °Any medical conditions you have, such as kidney failure. °Whether you are pregnant or may be pregnant. °What are the risks? °This is a safe procedure. But problems may occur, including: °Infection. °Bruising and bleeding. °Bleeding into the chest. °Stroke or blood clots. °Damage to nearby areas of your body. °Allergies to medicines or dyes. °The need for a pacemaker if the normal system is damaged. °Failure of the procedure to treat the problem. °  What happens before the procedure? °Medicines °Ask your doctor about: °Changing or stopping your normal medicines. This is important. °Taking aspirin and ibuprofen. Do not take these medicines unless your doctor tells you to take  them. °Taking other medicines, vitamins, herbs, and supplements. °General instructions °Follow instructions from your doctor about what you cannot eat or drink. °Plan to have someone take you home from the hospital or clinic. °If you will be going home right after the procedure, plan to have someone with you for 24 hours. °Ask your doctor what steps will be taken to prevent infection. °What happens during the procedure? ° °An IV tube will be put into one of your veins. °You will be given a medicine to help you relax. °The skin on your neck or groin will be numbed. °A cut (incision) will be made in your neck or groin. A needle will be put through your cut and into a large vein. °A tube (catheter) will be put into the needle. The tube will be moved to your heart. °Dye may be put through the tube. This helps your doctor see your heart. °Small devices (electrodes) on the tube will send out signals. °A type of energy will be used to destroy some heart tissue. °The tube will be taken out. °Pressure will be held on your cut. This helps stop bleeding. °A bandage will be put over your cut. °The exact procedure may vary among doctors and hospitals. °What happens after the procedure? °You will be watched until you leave the hospital or clinic. This includes checking your heart rate, breathing rate, oxygen, and blood pressure. °Your cut will be watched for bleeding. You will need to lie still for a few hours. °Do not drive for 24 hours or as long as your doctor tells you. °Summary °Cardiac ablation is a procedure to destroy some heart tissue. This is done to treat heart rhythm problems. °Tell your doctor about any medical conditions you may have. Tell him or her about all medicines you are taking to treat them. °This is a safe procedure. But problems may occur. These include infection, bruising, bleeding, and damage to nearby areas of your body. °Follow what your doctor tells you about food and drink. You may also be told to  change or stop some of your medicines. °After the procedure, do not drive for 24 hours or as long as your doctor tells you. °This information is not intended to replace advice given to you by your health care provider. Make sure you discuss any questions you have with your health care provider. °Document Revised: 04/08/2019 Document Reviewed: 04/08/2019 °Elsevier Patient Education © 2022 Elsevier Inc. ° ° ° °  ° ° °

## 2021-05-29 NOTE — Progress Notes (Signed)
Electrophysiology Office Note:    Date:  05/29/2021   ID:  Shane Peterson, DOB [birth year], MRN [id number]  PCP:  Ladora DanielBeal, Sheri, PA-C  [facility] Cardiologist:  None  [facility] Electrophysiologist:  Lanier PrudeAMERON T Larone Kliethermes, MD   Referring MD: Ladora DanielBeal, Sheri, PA-C   Chief Complaint: AF  History of Present Illness:    Shane Peterson is a 76 y.o. male who presents for an evaluation of AF at the request of Ladora DanielSheri Beal, [place]. Their medical history includes CAD s/p CABG, pAF. [facility] December [facility] for increasingly frequent AF episodes. He reported episodes several times per week. He feels tired, poor sleep and palpitations during the episodes. He also reported dizziness. The EP physician reviewed his Kardia tracings at that appointment and reported AF. [facility] [place] [facility] [place] [facility] [place]   Today he confirms the above. I also reviewed his smart watch ecg recordings today which show AF.    Past Medical History:  Diagnosis Date   Atrial fibrillation [facility](HCC)    Coronary artery disease    s/p CABG   Family history of adverse reaction to anesthesia    "daughter get sick" (03/10/2017)   GERD (gastroesophageal reflux disease)    HLD (hyperlipidemia)    HTN (hypertension)    IHD (ischemic heart disease)    PONV (postoperative nausea and vomiting)    PVC's (premature ventricular contractions)    Type II diabetes mellitus (HCC)     Past Surgical History:  Procedure Laterality Date   ANTERIOR CERVICAL DECOMP/DISCECTOMY FUSION  05/2000   [name]/notes 10/02/2010   APPENDECTOMY  1962   BACK SURGERY     CARDIAC CATHETERIZATION  07/2000; 07/2001, 08/2006   [name]/notes 10/02/2010   CORONARY ARTERY BYPASS GRAFT  07/2000   CABG X "5"   LAPAROSCOPIC CHOLECYSTECTOMY  01/2009   [name]/notes 02/24/2009   POSTERIOR CERVICAL FUSION/FORAMINOTOMY  2001   foraminectomy/notes 10/02/2010   TONSILLECTOMY      Current Medications: Current Meds  Medication Sig   Alcohol Swabs  (ALCOHOL PREP) PADS by Does not apply route.   Alogliptin Benzoate 25 MG TABS TAKE ONE TABLET BY MOUTH ONCE A DAY FOR DIABETES (CONVERTED FROM SAXAGLIPTIN)   ALPRAZolam (XANAX) 0.5 MG tablet Take by mouth.   amLODipine (NORVASC) 10 MG tablet Take 10 mg by mouth daily.   amoxicillin (AMOXIL) 875 MG tablet TAKE 1 TABLET TWICE DAILY. TAKE WITH FOOD.   apixaban (ELIQUIS) 5 MG TABS tablet Take 1 tablet (5 mg total) by mouth 2 (two) times daily.   azelastine (ASTELIN) 0.1 % nasal spray Place 2 sprays into both nostrils 2 (two) times daily. Use in each nostril as directed   carboxymethylcellulose (REFRESH PLUS) 0.5 % SOLN 1 drop 4 (four) times daily.   cephALEXin (KEFLEX) 500 MG capsule Take 4 capsules by mouth as needed. Use as directed   cetirizine (ZYRTEC) 10 MG tablet Take 10 mg by mouth at bedtime.    Cholecalciferol (VITAMIN D) 2000 UNITS tablet Take 2,000 Units by mouth daily.   cyanocobalamin (,VITAMIN B-12,) 1000 MCG/ML injection Inject 1,000 mcg into the muscle every 30 (thirty) days.   desonide (DESOWEN) 0.05 % cream APPLY THIN FILM TO AFFECTED AREA TWICE A DAY TO THE FACE TO THE AREA OF RASH--WHEN RASH IMPROVES YOU CAN TRY THE KETOCONAZOLE SHAMPOO BACK AGAIN IF NECESSARY   diclofenac Sodium (VOLTAREN) 1 % GEL APPLY 2 GRAMS TO AFFECTED AREA FOUR TIMES A DAY  esomeprazole (NEXIUM) 40 MG capsule Take 40 mg by mouth 2 (two) times daily before a meal.   fluticasone (FLONASE) 50 MCG/ACT nasal spray Place 1 spray into both nostrils daily as needed for allergies or rhinitis.   fluticasone (VERAMYST) 27.5 MCG/SPRAY nasal spray Place into the nose.   gabapentin (NEURONTIN) 100 MG capsule Take 300-400 mg by mouth See admin instructions. 300mg  in am, 400mg  in pm   glipiZIDE (GLUCOTROL) 5 MG tablet Take 10 mg by mouth 3 (three) times daily.    glucose blood test strip 1 each by Other route as needed for Other. Precision xtra   GUAIFENESIN PO Take 1 tablet by mouth 2 (two) times daily as needed for  allergies.   insulin glargine (LANTUS SOLOSTAR) 100 UNIT/ML Solostar Pen INJECT 26 UNITS SUBCUTANEOUSLY EVERY MORNING FOR DIABETES (APPROVED)   ketoconazole (NIZORAL) 2 % shampoo SHAMPOO SMALL AMOUNT AFFECTED AREA TIW (LEAVE ON AFFECTED AREA FOR 10 MINUTES, THEN RINSE OFF WITH WATER)   ketotifen (ZADITOR) 0.025 % ophthalmic solution INSTILL 1 DROP IN BOTH EYES TWICE A DAY AS NEEDED   lisinopril (PRINIVIL,ZESTRIL) 40 MG tablet Take 40 mg by mouth daily.   METFORMIN HCL PO Take by mouth.   methocarbamol (ROBAXIN) 500 MG tablet Take 500 mg by mouth every 8 (eight) hours as needed for muscle spasms.    naproxen (NAPROSYN) 375 MG tablet Take by mouth.   nitroGLYCERIN (NITROSTAT) 0.4 MG SL tablet Place 0.4 mg under the tongue every 5 (five) minutes as needed for chest pain.    nystatin-triamcinolone (MYCOLOG II) cream APPLY SMALL AMOUNT TO AFFECTED AREA TWICE A DAY   Omega-3 Fatty Acids (FISH OIL) 1000 MG CAPS Take 3 capsules by mouth 2 (two) times daily.    pravastatin (PRAVACHOL) 40 MG tablet Take 40 mg by mouth daily.   promethazine-dextromethorphan (PROMETHAZINE-DM) 6.25-15 MG/5ML syrup promethazine-DM 6.25 mg-15 mg/5 mL oral syrup  TAKE 1 TO 2 TEASPOON EVERY 4 TO 6 HOURS AND AT BEDTIME AS NEEDED FOR COUGH. MAY CAUSE DROWSINESS.   propranolol (INDERAL) 20 MG tablet Take 40 mg by mouth 3 (three) times daily.    sodium chloride (OCEAN) 0.65 % SOLN nasal spray Place 1 spray into both nostrils as needed for congestion.   triamcinolone cream (KENALOG) 0.1 % Apply topically.     Allergies:   Codeine and Sulfonamide derivatives   Social History   Socioeconomic History   Marital status: Married    Spouse name: Not on file   Number of children: 2   Years of education: Not on file   Highest education level: Not on file  Occupational History   Occupation: at [employer]  Tobacco Use   Smoking status: Never   Smokeless tobacco: Never  Vaping Use   Vaping Use: Never used   Substance and Sexual Activity   Alcohol use: No   Drug use: No   Sexual activity: Yes  Other Topics Concern   Not on file  Social History Narrative   Lives in [place]   Retired   Now sells concessions   Social Determinants of Health   Financial Resource Strain: Not on file  Food Insecurity: Not on file  Transportation Needs: Not on file  Physical Activity: Not on file  Stress: Not on file  Social Connections: Not on file     Family History: The patient's family history includes Atrial fibrillation in an other family member; Cerebral palsy in an other family member; Heart attack in an other family member;  Hypertension in an other family member.  ROS:   Please see the history of present illness.    All other systems reviewed and are negative.  EKGs/Labs/Other Studies Reviewed:    [facility]  EKG:  The ekg ordered today demonstrates sinus rhythm   Recent Labs: No results found for requested labs within last 8760 hours.  Recent Lipid Panel    Component Value Date/Time   CHOL  08/05/2010 1008    159        ATP III CLASSIFICATION:  <200     mg/dL   Desirable  353-299  mg/dL   Borderline High  >=242    mg/dL   High          TRIG 84 08/05/2010 1008   HDL 30 (L) 08/05/2010 1008   CHOLHDL 5.3 08/05/2010 1008   VLDL 17 08/05/2010 1008   LDLCALC (H) 08/05/2010 1008    112        Total Cholesterol/HDL:CHD Risk Coronary Heart Disease Risk Table                     Men   Women  1/2 Average Risk   3.4   3.3  Average Risk       5.0   4.4  2 X Average Risk   9.6   7.1  3 X Average Risk  23.4   11.0        Use the calculated Patient Ratio above and the CHD Risk Table to determine the patient's CHD Risk.        ATP III CLASSIFICATION (LDL):  <100     mg/dL   Optimal  683-419  mg/dL   Near or Above                    Optimal  130-159  mg/dL   Borderline  622-297  mg/dL   High  >989     mg/dL   Very High    Physical  Exam:    VS:  BP 118/64    Pulse (!) 57    Ht 5\' 8"  (1.727 m)    Wt 194 lb 9.6 oz (88.3 kg)    SpO2 98%    BMI 29.59 kg/m     Wt Readings from Last 3 Encounters:  05/29/21 194 lb 9.6 oz (88.3 kg)  03/25/18 188 lb (85.3 kg)  12/22/17 192 lb 6.4 oz (87.3 kg)     GEN:  Well nourished, well developed in no acute distress HEENT: Normal NECK: No JVD; No carotid bruits LYMPHATICS: No lymphadenopathy CARDIAC: RRR, no murmurs, rubs, gallops RESPIRATORY:  Clear to auscultation without rales, wheezing or rhonchi  ABDOMEN: Soft, non-tender, non-distended MUSCULOSKELETAL:  No edema; No deformity  SKIN: Warm and dry NEUROLOGIC:  Alert and oriented x 3 PSYCHIATRIC:  Normal affect       ASSESSMENT:    1. Paroxysmal atrial fibrillation (HCC)   2. Pre-op evaluation   3. Primary hypertension   4. Atherosclerosis of native coronary artery of native heart without angina pectoris    PLAN:    In order of problems listed above:  #Paroxysmal atrial fibrillation Symptomatic. Increasingly frequent. On Eliquis for stroke ppx. Discussed treatment options with the patient during today's visit including antiarrhythmic drug therapy and catheter ablation. We compared efficacy of the two strategies and discussed the risks and recovery of ablation. He would like to proceed with ablation.  Risk, benefits,  and alternatives to EP study and radiofrequency ablation for afib were also discussed in detail today. These risks include but are not limited to stroke, bleeding, vascular damage, tamponade, perforation, damage to the esophagus, lungs, and other structures, pulmonary vein stenosis, worsening renal function, and death. The patient understands these risk and wishes to proceed.  We will therefore proceed with catheter ablation at the next available time.  Carto, ICE, anesthesia are requested for the procedure.  Will also obtain CT PV protocol prior to the procedure to exclude LAA thrombus and further evaluate  atrial anatomy.  He will also need an echocardiogram prior to the procedure.  #HTN Controlled Continue current medical therapy.  #CAD s/p CABG No ischemic symptoms today.       Total time spent with patient today 60 minutes. This includes reviewing records, evaluating the patient and coordinating care.  Medication Adjustments/Labs and Tests Ordered: Current medicines are reviewed at length with the patient today.  Concerns regarding medicines are outlined above.  Orders Placed This Encounter  Procedures   CT CARDIAC MORPH/PULM VEIN W/CM&W/O CA SCORE   CBC w/Diff   Basic Metabolic Panel (BMET)   EKG 12-Lead   No orders of the defined types were placed in this encounter.    Signed, Rossie Muskratameron T. Lalla BrothersLambert, MD, [facility] Surgical CenterFACC, [facility] Surgical HospitalFHRS 05/29/2021 6:08 PM    Electrophysiology [facility]

## 2021-05-30 ENCOUNTER — Encounter: Payer: Self-pay | Admitting: *Deleted

## 2021-05-30 ENCOUNTER — Telehealth: Payer: No Typology Code available for payment source | Admitting: *Deleted

## 2021-05-30 NOTE — Telephone Encounter (Signed)
-----   Message from [name], MD sent at 05/29/2021  6:07 PM EST ----- Hey, he will need an echo before his AF ablation. Thanks, cameron

## 2021-05-30 NOTE — Telephone Encounter (Signed)
Spoke to the patient and he just had an echo through the [place] around a month ago. I will try and reach out to them for those records as the patient feels his insurance will not cover for that test again so soon.   Left message for medical records to call me back.   Spoke to [place] in medical records at the [place]. Got fax to send request. Checked with our medrec on what to send on request.   Request has been sent.

## 2021-06-01 NOTE — Telephone Encounter (Signed)
Shane Peterson is going to assist. Have not received records yet.

## 2021-06-08 NOTE — Telephone Encounter (Signed)
Received records. MD reviewed.

## 2021-07-05 ENCOUNTER — Other Ambulatory Visit: Payer: No Typology Code available for payment source

## 2021-08-02 ENCOUNTER — Other Ambulatory Visit: Payer: Self-pay

## 2021-08-02 ENCOUNTER — Other Ambulatory Visit: Payer: No Typology Code available for payment source

## 2021-08-02 LAB — CBC WITH DIFFERENTIAL/PLATELET
Basophils Absolute: 0.1 10*3/uL (ref 0.0–0.2)
Basos: 1 %
EOS (ABSOLUTE): 0.2 10*3/uL (ref 0.0–0.4)
Eos: 5 %
Hematocrit: 44.5 % (ref 37.5–51.0)
Hemoglobin: 14.6 g/dL (ref 13.0–17.7)
Immature Grans (Abs): 0.2 10*3/uL — ABNORMAL HIGH (ref 0.0–0.1)
Immature Granulocytes: 4 %
Lymphocytes Absolute: 0.7 10*3/uL (ref 0.7–3.1)
Lymphs: 14 %
MCH: 29.3 pg (ref 26.6–33.0)
MCHC: 32.8 g/dL (ref 31.5–35.7)
MCV: 89 fL (ref 79–97)
Monocytes Absolute: 0.5 10*3/uL (ref 0.1–0.9)
Monocytes: 11 %
Neutrophils Absolute: 3.1 10*3/uL (ref 1.4–7.0)
Neutrophils: 65 %
Platelets: 161 10*3/uL (ref 150–450)
RBC: 4.98 x10E6/uL (ref 4.14–5.80)
RDW: 13.9 % (ref 11.6–15.4)
WBC: 4.7 10*3/uL (ref 3.4–10.8)

## 2021-08-02 LAB — BASIC METABOLIC PANEL
BUN/Creatinine Ratio: 19 (ref 10–24)
BUN: 17 mg/dL (ref 8–27)
CO2: 26 mmol/L (ref 20–29)
Calcium: 9.4 mg/dL (ref 8.6–10.2)
Chloride: 101 mmol/L (ref 96–106)
Creatinine, Ser: 0.89 mg/dL (ref 0.76–1.27)
Glucose: 190 mg/dL — ABNORMAL HIGH (ref 70–99)
Potassium: 4.4 mmol/L (ref 3.5–5.2)
Sodium: 139 mmol/L (ref 134–144)
eGFR: 90 mL/min/{1.73_m2} (ref >59)

## 2021-08-15 ENCOUNTER — Telehealth (HOSPITAL_COMMUNITY): Payer: Self-pay | Admitting: Emergency Medicine

## 2021-08-15 NOTE — Telephone Encounter (Signed)
Reaching out to patient to offer assistance regarding upcoming cardiac imaging study; pt verbalizes understanding of appt date/time, parking situation and where to check in, pre-test NPO status and medications ordered, and verified current allergies; name and call back number provided for further questions should they arise ?Rockwell Alexandria RN Navigator Cardiac Imaging ?[facility] ?[phone] office ?[phone] cell ? ?Difficult iv  ?Denies iv issues ?Arrival 730 ? ?Pt questioning possibility of PPM/ICD after ablation, please return call to clarify with patient  ?

## 2021-08-16 ENCOUNTER — Ambulatory Visit (HOSPITAL_COMMUNITY)
Admission: RE | Admit: 2021-08-16 | Discharge: 2021-08-16 | Disposition: A | Discharge status: Home / Self Care | Payer: No Typology Code available for payment source | Source: Ambulatory Visit | Attending: Cardiology | Admitting: Cardiology

## 2021-08-16 DIAGNOSIS — I480 Paroxysmal atrial fibrillation: Secondary | ICD-10-CM | POA: Diagnosis not present

## 2021-08-16 MED ORDER — IOHEXOL 350 MG/ML SOLN
100.0000 mL | Freq: Once | INTRAVENOUS | Status: AC | PRN
  Administered 2021-08-16: 100 mL via INTRAVENOUS

## 2021-08-17 ENCOUNTER — Telehealth: Payer: Self-pay | Admitting: Cardiology

## 2021-08-17 ENCOUNTER — Encounter: Payer: Self-pay | Admitting: Cardiology

## 2021-08-17 NOTE — Telephone Encounter (Signed)
See other phone note

## 2021-08-17 NOTE — Telephone Encounter (Signed)
See phone note

## 2021-08-17 NOTE — Telephone Encounter (Signed)
Scheduler reached out with return call from patient to get number to transfer call to RN. Call was dropped before transfer took place.  ? ?Left message to call back.  ?

## 2021-08-17 NOTE — Telephone Encounter (Signed)
Answered all questions about up coming procedure and recovery. Discussed lack of need for device at this time. Educated that ablation will help stop the atrial fibrillation but should not affect his underlying rhythm.  ?Patient verbalized understanding.  ?

## 2021-08-17 NOTE — Telephone Encounter (Signed)
Pt returning a phone call... please advise ?

## 2021-08-21 NOTE — Pre-Procedure Instructions (Signed)
Instructed patient on the following items: °Arrival time 0730 °Nothing to eat or drink after midnight °No meds AM of procedure °Responsible person to drive you home and stay with you for 24 hrs ° °Have you missed any doses of anti-coagulant Eliquis- hasn't missed any doses °   °

## 2021-08-23 ENCOUNTER — Ambulatory Visit (HOSPITAL_COMMUNITY): Payer: No Typology Code available for payment source | Admitting: Certified Registered Nurse Anesthetist

## 2021-08-23 ENCOUNTER — Ambulatory Visit (HOSPITAL_BASED_OUTPATIENT_CLINIC_OR_DEPARTMENT_OTHER): Payer: No Typology Code available for payment source | Admitting: Certified Registered Nurse Anesthetist

## 2021-08-23 ENCOUNTER — Ambulatory Visit (HOSPITAL_COMMUNITY)
Admission: RE | Disposition: A | Discharge status: Home / Self Care | Payer: Self-pay | Source: Home / Self Care | Attending: Cardiology

## 2021-08-23 ENCOUNTER — Ambulatory Visit (HOSPITAL_COMMUNITY)
Admission: RE | Admit: 2021-08-23 | Discharge: 2021-08-23 | Disposition: A | Discharge status: Home / Self Care | Payer: No Typology Code available for payment source | Attending: Cardiology | Admitting: Cardiology

## 2021-08-23 ENCOUNTER — Other Ambulatory Visit (HOSPITAL_COMMUNITY): Payer: Self-pay

## 2021-08-23 ENCOUNTER — Other Ambulatory Visit: Payer: Self-pay

## 2021-08-23 ENCOUNTER — Encounter (HOSPITAL_COMMUNITY): Payer: Self-pay | Admitting: Cardiology

## 2021-08-23 DIAGNOSIS — I10 Essential (primary) hypertension: Secondary | ICD-10-CM | POA: Insufficient documentation

## 2021-08-23 DIAGNOSIS — I480 Paroxysmal atrial fibrillation: Secondary | ICD-10-CM

## 2021-08-23 DIAGNOSIS — Z951 Presence of aortocoronary bypass graft: Secondary | ICD-10-CM | POA: Diagnosis not present

## 2021-08-23 DIAGNOSIS — E119 Type 2 diabetes mellitus without complications: Secondary | ICD-10-CM

## 2021-08-23 DIAGNOSIS — F419 Anxiety disorder, unspecified: Secondary | ICD-10-CM | POA: Diagnosis not present

## 2021-08-23 DIAGNOSIS — K219 Gastro-esophageal reflux disease without esophagitis: Secondary | ICD-10-CM | POA: Insufficient documentation

## 2021-08-23 DIAGNOSIS — G709 Myoneural disorder, unspecified: Secondary | ICD-10-CM | POA: Diagnosis not present

## 2021-08-23 DIAGNOSIS — I2510 Atherosclerotic heart disease of native coronary artery without angina pectoris: Secondary | ICD-10-CM

## 2021-08-23 DIAGNOSIS — Z794 Long term (current) use of insulin: Secondary | ICD-10-CM | POA: Diagnosis not present

## 2021-08-23 DIAGNOSIS — I4891 Unspecified atrial fibrillation: Secondary | ICD-10-CM | POA: Diagnosis not present

## 2021-08-23 HISTORY — PX: ATRIAL FIBRILLATION ABLATION: EP1191

## 2021-08-23 LAB — GLUCOSE, CAPILLARY
Glucose-Capillary: 186 mg/dL — ABNORMAL HIGH (ref 70–99)
Glucose-Capillary: 232 mg/dL — ABNORMAL HIGH (ref 70–99)
Glucose-Capillary: 236 mg/dL — ABNORMAL HIGH (ref 70–99)

## 2021-08-23 LAB — POCT ACTIVATED CLOTTING TIME
Activated Clotting Time: 335 seconds
Activated Clotting Time: 341 seconds

## 2021-08-23 SURGERY — ATRIAL FIBRILLATION ABLATION
Anesthesia: General

## 2021-08-23 MED ORDER — SODIUM CHLORIDE 0.9% FLUSH: 3.0000 mL | INTRAVENOUS | Status: DC | PRN

## 2021-08-23 MED ORDER — HEPARIN (PORCINE) IN NACL 1000-0.9 UT/500ML-% IV SOLN
INTRAVENOUS | Status: AC
  Filled 2021-08-23: qty 500

## 2021-08-23 MED ORDER — HEPARIN SODIUM (PORCINE) 1000 UNIT/ML IJ SOLN
INTRAMUSCULAR | Status: AC
  Filled 2021-08-23: qty 10

## 2021-08-23 MED ORDER — SODIUM CHLORIDE 0.9 % IV SOLN: INTRAVENOUS | Status: DC

## 2021-08-23 MED ORDER — PROPOFOL 10 MG/ML IV BOLUS
INTRAVENOUS | Status: DC | PRN
  Administered 2021-08-23: 110 mg via INTRAVENOUS

## 2021-08-23 MED ORDER — SODIUM CHLORIDE 0.9 % IV SOLN: 250.0000 mL | INTRAVENOUS | Status: DC | PRN

## 2021-08-23 MED ORDER — SUGAMMADEX SODIUM 200 MG/2ML IV SOLN
INTRAVENOUS | Status: DC | PRN
Start: 2021-08-23 — End: 2021-08-23
  Administered 2021-08-23: 200 mg via INTRAVENOUS

## 2021-08-23 MED ORDER — COLCHICINE 0.6 MG PO TABS
0.6000 mg | ORAL_TABLET | Freq: Two times a day (BID) | ORAL | Status: DC
  Administered 2021-08-23: 0.6 mg via ORAL
  Filled 2021-08-23: qty 1

## 2021-08-23 MED ORDER — COLCHICINE 0.6 MG PO TABS
0.6000 mg | ORAL_TABLET | Freq: Two times a day (BID) | ORAL | 0 refills | Status: AC
  Filled 2021-08-23: qty 10, 5d supply, fill #0

## 2021-08-23 MED ORDER — ONDANSETRON HCL 4 MG/2ML IJ SOLN: 4.0000 mg | Freq: Four times a day (QID) | INTRAMUSCULAR | Status: DC | PRN

## 2021-08-23 MED ORDER — HEPARIN (PORCINE) IN NACL 1000-0.9 UT/500ML-% IV SOLN
INTRAVENOUS | Status: AC
  Filled 2021-08-23: qty 2000

## 2021-08-23 MED ORDER — HEPARIN (PORCINE) IN NACL 2-0.9 UNITS/ML
INTRAMUSCULAR | Status: AC | PRN
  Administered 2021-08-23 (×4): 500 mL

## 2021-08-23 MED ORDER — PROPOFOL 500 MG/50ML IV EMUL
INTRAVENOUS | Status: DC | PRN
  Administered 2021-08-23: 140 ug/kg/min via INTRAVENOUS
  Administered 2021-08-23: 125 ug/kg/min via INTRAVENOUS

## 2021-08-23 MED ORDER — PHENYLEPHRINE HCL-NACL 20-0.9 MG/250ML-% IV SOLN
INTRAVENOUS | Status: DC | PRN
  Administered 2021-08-23: 20 ug/min via INTRAVENOUS

## 2021-08-23 MED ORDER — LIDOCAINE 2% (20 MG/ML) 5 ML SYRINGE
INTRAMUSCULAR | Status: DC | PRN
  Administered 2021-08-23: 40 mg via INTRAVENOUS

## 2021-08-23 MED ORDER — ISOPROTERENOL HCL 0.2 MG/ML IJ SOLN
INTRAVENOUS | Status: DC | PRN
  Administered 2021-08-23: 2 ug/min via INTRAVENOUS

## 2021-08-23 MED ORDER — PANTOPRAZOLE SODIUM 40 MG PO TBEC
40.0000 mg | DELAYED_RELEASE_TABLET | Freq: Every day | ORAL | Status: DC
  Administered 2021-08-23: 40 mg via ORAL
  Filled 2021-08-23: qty 1

## 2021-08-23 MED ORDER — PROTAMINE SULFATE 10 MG/ML IV SOLN
INTRAVENOUS | Status: DC | PRN
  Administered 2021-08-23: 10 mg via INTRAVENOUS

## 2021-08-23 MED ORDER — ONDANSETRON HCL 4 MG/2ML IJ SOLN
INTRAMUSCULAR | Status: DC | PRN
  Administered 2021-08-23: 4 mg via INTRAVENOUS

## 2021-08-23 MED ORDER — DEXAMETHASONE SODIUM PHOSPHATE 10 MG/ML IJ SOLN
INTRAMUSCULAR | Status: DC | PRN
  Administered 2021-08-23: 10 mg via INTRAVENOUS

## 2021-08-23 MED ORDER — HEPARIN SODIUM (PORCINE) 1000 UNIT/ML IJ SOLN
INTRAMUSCULAR | Status: DC | PRN
  Administered 2021-08-23: 1000 [IU] via INTRAVENOUS

## 2021-08-23 MED ORDER — FENTANYL CITRATE (PF) 250 MCG/5ML IJ SOLN
INTRAMUSCULAR | Status: DC | PRN
Start: 2021-08-23 — End: 2021-08-23
  Administered 2021-08-23 (×2): 50 ug via INTRAVENOUS

## 2021-08-23 MED ORDER — ISOPROTERENOL HCL 0.2 MG/ML IJ SOLN
INTRAMUSCULAR | Status: AC
  Filled 2021-08-23: qty 5

## 2021-08-23 MED ORDER — ACETAMINOPHEN 325 MG PO TABS: 650.0000 mg | ORAL_TABLET | ORAL | Status: DC | PRN

## 2021-08-23 MED ORDER — SODIUM CHLORIDE 0.9% FLUSH: 3.0000 mL | Freq: Two times a day (BID) | INTRAVENOUS | Status: DC

## 2021-08-23 MED ORDER — HEPARIN SODIUM (PORCINE) 1000 UNIT/ML IJ SOLN
INTRAMUSCULAR | Status: DC | PRN
  Administered 2021-08-23: 3000 [IU] via INTRAVENOUS
  Administered 2021-08-23: 2000 [IU] via INTRAVENOUS
  Administered 2021-08-23: 13000 [IU] via INTRAVENOUS

## 2021-08-23 MED ORDER — ROCURONIUM BROMIDE 10 MG/ML (PF) SYRINGE
PREFILLED_SYRINGE | INTRAVENOUS | Status: DC | PRN
  Administered 2021-08-23 (×2): 20 mg via INTRAVENOUS
  Administered 2021-08-23: 70 mg via INTRAVENOUS

## 2021-08-23 MED ORDER — APIXABAN 5 MG PO TABS
5.0000 mg | ORAL_TABLET | Freq: Two times a day (BID) | ORAL | Status: DC
  Administered 2021-08-23: 5 mg via ORAL
  Filled 2021-08-23: qty 1

## 2021-08-23 SURGICAL SUPPLY — 18 items
CATH MAPPNG PENTARAY F 2-6-2MM (CATHETERS) IMPLANT
CATH S CIRCA THERM PROBE 10F (CATHETERS) ×1 IMPLANT
CATH SMTCH THERMOCOOL SF DF (CATHETERS) ×1 IMPLANT
CATH SOUNDSTAR 3D IMAGING (CATHETERS) ×1 IMPLANT
CATH WEB BI DIR CSDF CRV REPRO (CATHETERS) ×1 IMPLANT
CLOSURE PERCLOSE PROSTYLE (VASCULAR PRODUCTS) ×3 IMPLANT
COVER SWIFTLINK CONNECTOR (BAG) ×2 IMPLANT
PACK EP LATEX FREE (CUSTOM PROCEDURE TRAY) ×2
PACK EP LF (CUSTOM PROCEDURE TRAY) ×1 IMPLANT
PAD DEFIB RADIO PHYSIO CONN (PAD) ×2 IMPLANT
PENTARAY F 2-6-2MM (CATHETERS) ×2
SHEATH AVANTI 11F 11CM (SHEATH) ×1 IMPLANT
SHEATH BAYLIS TRANSSEPTAL 98CM (NEEDLE) ×1 IMPLANT
SHEATH CARTO VIZIGO SM CVD (SHEATH) ×1 IMPLANT
SHEATH INTRO SL0 8.5F 63 (SHEATH) ×1 IMPLANT
SHEATH PINNACLE 8F 10CM (SHEATH) ×2 IMPLANT
SHEATH PROBE COVER 6X72 (BAG) ×1 IMPLANT
TUBING SMART ABLATE COOLFLOW (TUBING) ×1 IMPLANT

## 2021-08-23 NOTE — Anesthesia Procedure Notes (Signed)
Procedure Name: Intubation ?Date/Time: 08/23/2021 10:33 AM ?Performed by: Garfield Cornea, CRNA ?Pre-anesthesia Checklist: Patient identified, Emergency Drugs available, Suction available and Patient being monitored ?Patient Re-evaluated:Patient Re-evaluated prior to induction ?Oxygen Delivery Method: Circle System Utilized ?Preoxygenation: Pre-oxygenation with 100% oxygen ?Induction Type: IV induction ?Ventilation: Oral airway inserted - appropriate to patient size ?Laryngoscope Size: Glidescope and 4 ?Grade View: Grade I ?Tube type: Oral ?Tube size: 7.5 mm ?Number of attempts: 1 ?Airway Equipment and Method: Rigid stylet, Video-laryngoscopy and Oral airway ?Placement Confirmation: ETT inserted through vocal cords under direct vision, positive ETCO2 and breath sounds checked- equal and bilateral ?Secured at: 22 cm ?Tube secured with: Tape ?Dental Injury: Teeth and Oropharynx as per pre-operative assessment  ?Difficulty Due To: Difficulty was anticipated, Difficult Airway- due to anterior larynx and Difficult Airway- due to limited oral opening ?Comments: Elective glidescope intubation d/t anterior airway, recessed chin, Mallampati IV on assessment. ? ? ? ? ?

## 2021-08-23 NOTE — H&P (Signed)
?Electrophysiology Office Note:   ?  ?Date:  08/23/2021  ?  ?ID:  Shane Peterson, DOB 1946/02/10, MRN [id number] ?  ?PCP:  Ladora DanielBeal, Sheri, PA-C      ?[facility] Cardiologist:  None  ?[facility] Electrophysiologist:  Lanier PrudeAMERON T Soren Lazarz, MD  ?  ?Referring MD: Ladora DanielBeal, Sheri, PA-C  ?  ?Chief Complaint: AF ?  ?History of Present Illness:   ?  ?Shane Peterson is a 76 y.o. male who presents for an evaluation of AF at the request of Ladora DanielSheri Beal, [place]. Their medical history includes CAD s/p CABG, pAF. He was [facility] in December 2022 for increasingly frequent AF episodes. He reported episodes several times per week. He feels tired, poor sleep and palpitations during the episodes. He also reported dizziness. The EP physician reviewed his Kardia tracings at that appointment and reported AF. [facility] [place] [facility] [place].  ?  ?Today he confirms the above. I also reviewed his smart watch ecg recordings today which show AF. ? ?Presents for PVI today. ?  ?  ?Objective  ?  ?    ?Past Medical History:  ?Diagnosis Date  ? Atrial fibrillation (HCC)    ? Coronary artery disease    ?  s/p CABG  ? Family history of adverse reaction to anesthesia    ?  "daughter get sick" (03/10/2017)  ? GERD (gastroesophageal reflux disease)    ? HLD (hyperlipidemia)    ? HTN (hypertension)    ? IHD (ischemic heart disease)    ? PONV (postoperative nausea and vomiting)    ? PVC's (premature ventricular contractions)    ? Type II diabetes mellitus (HCC)    ?  ?  ?     ?Past Surgical History:  ?Procedure Laterality Date  ? ANTERIOR CERVICAL DECOMP/DISCECTOMY FUSION   05/2000  ?  [name]/notes 10/02/2010  ? APPENDECTOMY   1962  ? BACK SURGERY      ? CARDIAC CATHETERIZATION   07/2000; 07/2001, 08/2006  ?  [name]/notes 10/02/2010  ? CORONARY ARTERY BYPASS GRAFT   07/2000  ?  CABG X "5"  ? LAPAROSCOPIC CHOLECYSTECTOMY   01/2009  ?  [name]/notes 02/24/2009  ? POSTERIOR CERVICAL FUSION/FORAMINOTOMY   2001  ?  foraminectomy/notes 10/02/2010  ?  TONSILLECTOMY      ?  ?  ?Current Medications: ?Active Medications  ?    ?Current Meds  ?Medication Sig  ? Alcohol Swabs (ALCOHOL PREP) PADS by Does not apply route.  ? Alogliptin Benzoate 25 MG TABS TAKE ONE TABLET BY MOUTH ONCE A DAY FOR DIABETES (CONVERTED FROM SAXAGLIPTIN)  ? ALPRAZolam (XANAX) 0.5 MG tablet Take by mouth.  ? amLODipine (NORVASC) 10 MG tablet Take 10 mg by mouth daily.  ? amoxicillin (AMOXIL) 875 MG tablet TAKE 1 TABLET TWICE DAILY. TAKE WITH FOOD.  ? apixaban (ELIQUIS) 5 MG TABS tablet Take 1 tablet (5 mg total) by mouth 2 (two) times daily.  ? azelastine (ASTELIN) 0.1 % nasal spray Place 2 sprays into both nostrils 2 (two) times daily. Use in each nostril as directed  ? carboxymethylcellulose (REFRESH PLUS) 0.5 % SOLN 1 drop 4 (four) times daily.  ? cephALEXin (KEFLEX) 500 MG capsule Take 4 capsules by mouth as needed. Use as directed  ? cetirizine (ZYRTEC) 10 MG tablet Take 10 mg by mouth at bedtime.   ? Cholecalciferol (VITAMIN D) 2000 UNITS tablet Take 2,000 Units by mouth daily.  ? cyanocobalamin (,VITAMIN B-12,)  1000 MCG/ML injection Inject 1,000 mcg into the muscle every 30 (thirty) days.  ? desonide (DESOWEN) 0.05 % cream APPLY THIN FILM TO AFFECTED AREA TWICE A DAY TO THE FACE TO THE AREA OF RASH--WHEN RASH IMPROVES YOU CAN TRY THE KETOCONAZOLE SHAMPOO BACK AGAIN IF NECESSARY  ? diclofenac Sodium (VOLTAREN) 1 % GEL APPLY 2 GRAMS TO AFFECTED AREA FOUR TIMES A DAY  ? esomeprazole (NEXIUM) 40 MG capsule Take 40 mg by mouth 2 (two) times daily before a meal.  ? fluticasone (FLONASE) 50 MCG/ACT nasal spray Place 1 spray into both nostrils daily as needed for allergies or rhinitis.  ? fluticasone (VERAMYST) 27.5 MCG/SPRAY nasal spray Place into the nose.  ? gabapentin (NEURONTIN) 100 MG capsule Take 300-400 mg by mouth See admin instructions. 300mg  in am, 400mg  in pm  ? glipiZIDE (GLUCOTROL) 5 MG tablet Take 10 mg by mouth 3 (three) times daily.   ? glucose blood test strip 1 each by Other  route as needed for Other. Precision xtra  ? GUAIFENESIN PO Take 1 tablet by mouth 2 (two) times daily as needed for allergies.  ? insulin glargine (LANTUS SOLOSTAR) 100 UNIT/ML Solostar Pen INJECT 26 UNITS SUBCUTANEOUSLY EVERY MORNING FOR DIABETES (APPROVED)  ? ketoconazole (NIZORAL) 2 % shampoo SHAMPOO SMALL AMOUNT AFFECTED AREA TIW (LEAVE ON AFFECTED AREA FOR 10 MINUTES, THEN RINSE OFF WITH WATER)  ? ketotifen (ZADITOR) 0.025 % ophthalmic solution INSTILL 1 DROP IN BOTH EYES TWICE A DAY AS NEEDED  ? lisinopril (PRINIVIL,ZESTRIL) 40 MG tablet Take 40 mg by mouth daily.  ? METFORMIN HCL PO Take by mouth.  ? methocarbamol (ROBAXIN) 500 MG tablet Take 500 mg by mouth every 8 (eight) hours as needed for muscle spasms.   ? naproxen (NAPROSYN) 375 MG tablet Take by mouth.  ? nitroGLYCERIN (NITROSTAT) 0.4 MG SL tablet Place 0.4 mg under the tongue every 5 (five) minutes as needed for chest pain.   ? nystatin-triamcinolone (MYCOLOG II) cream APPLY SMALL AMOUNT TO AFFECTED AREA TWICE A DAY  ? Omega-3 Fatty Acids (FISH OIL) 1000 MG CAPS Take 3 capsules by mouth 2 (two) times daily.   ? pravastatin (PRAVACHOL) 40 MG tablet Take 40 mg by mouth daily.  ? promethazine-dextromethorphan (PROMETHAZINE-DM) 6.25-15 MG/5ML syrup promethazine-DM 6.25 mg-15 mg/5 mL oral syrup ? TAKE 1 TO 2 TEASPOON EVERY 4 TO 6 HOURS AND AT BEDTIME AS NEEDED FOR COUGH. MAY CAUSE DROWSINESS.  ? propranolol (INDERAL) 20 MG tablet Take 40 mg by mouth 3 (three) times daily.   ? sodium chloride (OCEAN) 0.65 % SOLN nasal spray Place 1 spray into both nostrils as needed for congestion.  ? triamcinolone cream (KENALOG) 0.1 % Apply topically.  ?  ?  ?  ?Allergies:   Codeine and Sulfonamide derivatives  ?  ?Social History  ?  ?     ?Socioeconomic History  ? Marital status: Married  ?    Spouse name: Not on file  ? Number of children: 2  ? Years of education: Not on file  ? Highest education level: Not on file  ?Occupational History  ? Occupation: at  [employer]  ?Tobacco Use  ? Smoking status: Never  ? Smokeless tobacco: Never  ?Vaping Use  ? Vaping Use: Never used  ?Substance and Sexual Activity  ? Alcohol use: No  ? Drug use: No  ? Sexual activity: Yes  ?Other Topics Concern  ? Not on file  ?Social History Narrative  ?  Lives in [place]  ?  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  ?Social Determinants of Health  ?  ?Financial Resource Strain: Not on file  ?Food Insecurity: Not on file  ?Transportation Needs: Not on file  ?Physical Activity: Not on file  ?Stress: Not on file  ?Social Connections: Not on file  ?  ?  ?Family History: ?The patient's family history includes Atrial fibrillation in an other family member; Cerebral palsy in an other family member; Heart attack in an other family member; Hypertension in an other family member. ?  ?ROS:   ?Please see the history of present illness.    ?All other systems reviewed and are negative. ?  ?EKGs/Labs/Other Studies Reviewed:   ?  ?The following studies were reviewed today: ?[facility] ?  ?EKG:  The ekg ordered today demonstrates sinus rhythm ?  ?  ?Recent Labs: ?No results found for requested labs within last 8760 hours.  ?Recent Lipid Panel ?Labs (Brief)  ?      ?   ?Component Value Date/Time  ?  CHOL   08/05/2010 1008  ?    159        ?ATP III CLASSIFICATION: ? <200     mg/dL   Desirable ? 284-132  mg/dL   Borderline High ? >=440    mg/dL   High ?        ?  TRIG 84 08/05/2010 1008  ?  HDL 30 (L) 08/05/2010 1008  ?  CHOLHDL 5.3 08/05/2010 1008  ?  VLDL 17 08/05/2010 1008  ?  LDLCALC (H) 08/05/2010 1008  ?    112        ?Total Cholesterol/HDL:CHD Risk ?Coronary Heart Disease Risk Table ?                    Men   Women ? 1/2 Average Risk   3.4   3.3 ? Average Risk       5.0   4.4 ? 2 X Average Risk   9.6   7.1 ? 3 X Average Risk  23.4   11.0 ?       ?Use the calculated Patient Ratio ?above and the CHD Risk Table ?to determine the patient's CHD Risk. ?       ?ATP III CLASSIFICATION (LDL): ? <100      mg/dL   Optimal ? 102-725  mg/dL   Near or Above ?                   Optimal ? 130-159  mg/dL   Borderline ? 160-189  mg/dL   High ? >366     mg/dL   Very High  ?  ?  ?  ?Physical Exam:   ?  ?VS:  BP 118/64

## 2021-08-23 NOTE — Anesthesia Preprocedure Evaluation (Addendum)
Anesthesia Evaluation  ?Patient identified by MRN, date of birth, ID band ?Patient awake ? ? ? ?Reviewed: ?Allergy & Precautions, NPO status , Patient's Chart, lab work & pertinent test results ? ?History of Anesthesia Complications ?(+) PONV and history of anesthetic complications ? ?Airway ?Mallampati: IV ? ?TM Distance: <3 FB ?Neck ROM: Full ? ?Mouth opening: Limited Mouth Opening ? Dental ? ?(+) Dental Advisory Given, Missing, Poor Dentition,  ?  ?Pulmonary ?neg pulmonary ROS,  ?  ?breath sounds clear to auscultation ? ? ? ? ? ? Cardiovascular ?hypertension, Pt. on medications and Pt. on home beta blockers ?(-) angina+ CAD and + CABG  ?+ dysrhythmias Atrial Fibrillation  ?Rhythm:Regular  ?- Left ventricle: The cavity size was normal. Systolic function was  ???normal. The estimated ejection fraction was in the range of 60%  ???to 65%. Wall motion was normal; there were no regional wall  ???motion abnormalities. Left ventricular diastolic function  ???parameters were normal.  ?- Pulmonary arteries: PA peak pressure: 37 mm Hg (S). ?  ?Neuro/Psych ?PSYCHIATRIC DISORDERS Anxiety  Neuromuscular disease   ? GI/Hepatic ?Neg liver ROS, GERD  Medicated and Controlled,  ?Endo/Other  ?diabetes, Insulin DependentLab Results ?     Component                Value               Date                 ?     HGBA1C                   7.4 (H)             09/15/2014           ? ? Renal/GU ?negative Renal ROSLab Results ?     Component                Value               Date                 ?     CREATININE               0.89                08/02/2021           ?  ? ?  ?Musculoskeletal ? ? Abdominal ?  ?Peds ? Hematology ? ?(+) Blood dyscrasia, , Lab Results ?     Component                Value               Date                 ?     WBC                      4.7                 08/02/2021           ?     HGB                      14.6                08/02/2021           ?  HCT                       44.5                08/02/2021           ?     MCV                      89                  08/02/2021           ?     PLT                      161                 08/02/2021           ? ? ?eliquis   ?Anesthesia Other Findings ? ? Reproductive/Obstetrics ? ?  ? ? ? ? ? ? ? ? ? ? ? ? ? ?  ?  ? ? ? ? ? ? ? ?Anesthesia Physical ?Anesthesia Plan ? ?ASA: 3 ? ?Anesthesia Plan: General  ? ?Post-op Pain Management: Minimal or no pain anticipated  ? ?Induction: Intravenous ? ?PONV Risk Score and Plan: 3 and Propofol infusion, Ondansetron, Dexamethasone, TIVA and Treatment may vary due to age or medical condition ? ?Airway Management Planned: Oral ETT and Video Laryngoscope Planned ? ?Additional Equipment: None ? ?Intra-op Plan:  ? ?Post-operative Plan: Extubation in OR ? ?Informed Consent: I have reviewed the patients History and Physical, chart, labs and discussed the procedure including the risks, benefits and alternatives for the proposed anesthesia with the patient or authorized representative who has indicated his/her understanding and acceptance.  ? ? ? ?Dental advisory given ? ?Plan Discussed with: CRNA and Anesthesiologist ? ?Anesthesia Plan Comments:   ? ? ? ? ? ?Anesthesia Quick Evaluation ? ?

## 2021-08-23 NOTE — Transfer of Care (Signed)
Immediate Anesthesia Transfer of Care Note ? ?Patient: Shane Peterson ? ?Procedure(s) Performed: ATRIAL FIBRILLATION ABLATION ? ?Patient Location: Cath Lab ? ?Anesthesia Type:General ? ?Level of Consciousness: drowsy ? ?Airway & Oxygen Therapy: Patient Spontanous Breathing and Patient connected to nasal cannula oxygen ? ?Post-op Assessment: Report given to RN and Post -op Vital signs reviewed and stable ? ?Post vital signs: Reviewed and stable ? ?Last Vitals:  ?Vitals Value Taken Time  ?BP 109/57 08/23/21 1253  ?Temp    ?Pulse 58 08/23/21 1255  ?Resp 13 08/23/21 1255  ?SpO2 92 % 08/23/21 1255  ?Vitals shown include unvalidated device data. ? ?Last Pain:  ?Vitals:  ? 08/23/21 0814  ?TempSrc:   ?PainSc: 0-No pain  ?   ? ?  ? ?Complications: No notable events documented. ?

## 2021-08-23 NOTE — Anesthesia Postprocedure Evaluation (Signed)
Anesthesia Post Note ? ?Patient: Shane Peterson ? ?Procedure(s) Performed: ATRIAL FIBRILLATION ABLATION ? ?  ? ?Patient location during evaluation: Nursing Unit ?Anesthesia Type: General ?Level of consciousness: awake and alert ?Pain management: pain level controlled ?Vital Signs Assessment: post-procedure vital signs reviewed and stable ?Respiratory status: spontaneous breathing, nonlabored ventilation, respiratory function stable and patient connected to nasal cannula oxygen ?Cardiovascular status: blood pressure returned to baseline and stable ?Postop Assessment: no apparent nausea or vomiting ?Anesthetic complications: no ? ? ?No notable events documented. ? ?Last Vitals:  ?Vitals:  ? 08/23/21 1433 08/23/21 1500  ?BP: 125/66 132/65  ?Pulse: (!) 54 (!) 58  ?Resp: 11 11  ?Temp:    ?SpO2: 94% 95%  ?  ?Last Pain:  ?Vitals:  ? 08/23/21 1345  ?TempSrc:   ?PainSc: 0-No pain  ? ? ?  ?  ?  ?  ?  ?  ? ?Shane Peterson ? ? ? ? ?

## 2021-08-27 ENCOUNTER — Encounter: Payer: Self-pay | Admitting: Cardiology

## 2021-08-27 MED FILL — Heparin Sod (Porcine)-NaCl IV Soln 1000 Unit/500ML-0.9%: INTRAVENOUS | Qty: 1500 | Status: AC

## 2021-09-20 ENCOUNTER — Ambulatory Visit (HOSPITAL_COMMUNITY)
Admission: RE | Admit: 2021-09-20 | Discharge: 2021-09-20 | Disposition: A | Discharge status: Home / Self Care | Payer: No Typology Code available for payment source | Source: Ambulatory Visit | Attending: Physician Assistant | Admitting: Physician Assistant

## 2021-09-20 ENCOUNTER — Encounter (HOSPITAL_COMMUNITY): Payer: Self-pay | Admitting: Physician Assistant

## 2021-09-20 VITALS — BP 146/70 | HR 53 | Ht 68.0 in | Wt 189.2 lb

## 2021-09-20 DIAGNOSIS — I10 Essential (primary) hypertension: Secondary | ICD-10-CM | POA: Insufficient documentation

## 2021-09-20 DIAGNOSIS — E785 Hyperlipidemia, unspecified: Secondary | ICD-10-CM | POA: Insufficient documentation

## 2021-09-20 DIAGNOSIS — D6869 Other thrombophilia: Secondary | ICD-10-CM | POA: Insufficient documentation

## 2021-09-20 DIAGNOSIS — E119 Type 2 diabetes mellitus without complications: Secondary | ICD-10-CM | POA: Insufficient documentation

## 2021-09-20 DIAGNOSIS — I480 Paroxysmal atrial fibrillation: Secondary | ICD-10-CM | POA: Diagnosis not present

## 2021-09-20 DIAGNOSIS — Z951 Presence of aortocoronary bypass graft: Secondary | ICD-10-CM | POA: Insufficient documentation

## 2021-09-20 DIAGNOSIS — I2510 Atherosclerotic heart disease of native coronary artery without angina pectoris: Secondary | ICD-10-CM | POA: Diagnosis not present

## 2021-09-20 DIAGNOSIS — I4891 Unspecified atrial fibrillation: Secondary | ICD-10-CM | POA: Diagnosis not present

## 2021-09-20 DIAGNOSIS — Z7901 Long term (current) use of anticoagulants: Secondary | ICD-10-CM | POA: Insufficient documentation

## 2021-09-20 NOTE — Progress Notes (Signed)
? ? ?Primary Care Physician: Nicholes Rough, PA-C ?Primary Electrophysiologist: Dr Quentin Ore ?Referring Physician: Dr Quentin Ore ? ? ?Shane Peterson is a 76 y.o. male with a history of CAD s/p CABG, HTN, HLD, DM, atrial fibrillation who presents for consultation in the [facility]. He was seen by [facility] EP provider in December 2022 for increasingly frequent AF episodes. He reported episodes several times per week. He feels tired, poor sleep and palpitations during the episodes. He also reported dizziness. The EP physician reviewed his Kardia tracings at that appointment and reported AF. He was referred to Dr Quentin Ore for afib ablation consideration. Patient is on Eliquis for a CHADS2VASC score of 4. ? ?On follow up today, patient is s/p afib ablation with Dr Quentin Ore on 08/23/21. He reports that he has done very well since the ablation. He states he occasionally feels his heart "try to go into afib" but it stops within a second or two. He denies CP, swallowing pain, or groin issues.  ? ?Today, he denies symptoms of chest pain, shortness of breath, orthopnea, PND, lower extremity edema, dizziness, presyncope, syncope, snoring, daytime somnolence, bleeding, or neurologic sequela. The patient is tolerating medications without difficulties and is otherwise without complaint today.  ? ? ?Atrial Fibrillation Risk Factors: ? ?he does not have symptoms or diagnosis of sleep apnea. ?he does not have a history of rheumatic fever. ? ? ?he has a BMI of Body mass index is 28.77 kg/m?Marland KitchenMarland Kitchen ?Filed Weights  ? 09/20/21 1106  ?Weight: 85.8 kg  ? ? ?Family History  ?Problem Relation Age of Onset  ? Heart attack Other   ? Atrial fibrillation Other   ? Hypertension Other   ? Cerebral palsy Other   ? ? ? ?Atrial Fibrillation Management history: ? ?Previous antiarrhythmic drugs: none ?Previous cardioversions: none ?Previous ablations: 08/23/21 ?CHADS2VASC score: 4 ?Anticoagulation history: Eliquis ? ? ?Past Medical History:   ?Diagnosis Date  ? Atrial fibrillation ([place])   ? Coronary artery disease   ? s/p CABG  ? Family history of adverse reaction to anesthesia   ? "daughter get sick" (03/10/2017)  ? GERD (gastroesophageal reflux disease)   ? HLD (hyperlipidemia)   ? HTN (hypertension)   ? IHD (ischemic heart disease)   ? PONV (postoperative nausea and vomiting)   ? PVC's (premature ventricular contractions)   ? Type II diabetes mellitus ([place])   ? ?Past Surgical History:  ?Procedure Laterality Date  ? ANTERIOR CERVICAL DECOMP/DISCECTOMY FUSION  05/2000  ? Archie Endo 10/02/2010  ? APPENDECTOMY  1962  ? ATRIAL FIBRILLATION ABLATION N/A 08/23/2021  ? Procedure: ATRIAL FIBRILLATION ABLATION;  Surgeon: Vickie Epley, MD;  Location: [facility];  Service: Cardiovascular;  Laterality: N/A;  ? BACK SURGERY    ? CARDIAC CATHETERIZATION  07/2000; 07/2001, 08/2006  ? Archie Endo 10/02/2010  ? CORONARY ARTERY BYPASS GRAFT  07/2000  ? CABG X "5"  ? LAPAROSCOPIC CHOLECYSTECTOMY  01/2009  ? Archie Endo 02/24/2009  ? POSTERIOR CERVICAL FUSION/FORAMINOTOMY  2001  ? foraminectomy/notes 10/02/2010  ? TONSILLECTOMY    ? ? ?Current Outpatient Medications  ?Medication Sig Dispense Refill  ? acetaminophen (TYLENOL) 500 MG tablet Take 1,000 mg by mouth every 8 (eight) hours as needed for moderate pain.    ? Alcohol Swabs (ALCOHOL PREP) PADS by Does not apply route.    ? Alogliptin Benzoate 25 MG TABS Take 25 mg by mouth daily.    ? ALPRAZolam (XANAX) 0.5 MG tablet Take 0.5 mg by mouth 2 (two)  times daily as needed for anxiety.    ? amLODipine (NORVASC) 10 MG tablet Take 10 mg by mouth daily.    ? apixaban (ELIQUIS) 5 MG TABS tablet Take 1 tablet (5 mg total) by mouth 2 (two) times daily. 60 tablet 0  ? carboxymethylcellulose (REFRESH PLUS) 0.5 % SOLN Place 1 drop into both eyes 3 (three) times daily as needed (dry eyes).    ? cetirizine (ZYRTEC) 10 MG tablet Take 10 mg by mouth daily.    ? Cholecalciferol (VITAMIN D) 2000 UNITS tablet Take 2,000 Units by mouth daily.     ? diclofenac Sodium (VOLTAREN) 1 % GEL Apply 2 g topically daily as needed (pain).    ? esomeprazole (NEXIUM) 40 MG capsule Take 40 mg by mouth 2 (two) times daily before a meal.    ? fluticasone (FLONASE) 50 MCG/ACT nasal spray Place 2 sprays into both nostrils daily as needed for allergies or rhinitis.    ? gabapentin (NEURONTIN) 400 MG capsule Take 400 mg by mouth 2 (two) times daily.    ? glipiZIDE (GLUCOTROL) 10 MG tablet Take 10 mg by mouth 2 (two) times daily before a meal.    ? glucose blood test strip 1 each by Other route as needed for Other. Precision xtra    ? guaifenesin (HUMIBID E) 400 MG TABS tablet Take 400 mg by mouth in the morning and at bedtime.    ? insulin glargine (LANTUS SOLOSTAR) 100 UNIT/ML Solostar Pen Inject 26 Units into the skin every morning.    ? ketotifen (ZADITOR) 0.025 % ophthalmic solution Place 1 drop into both eyes 2 (two) times daily as needed (eye allergies).    ? lisinopril (PRINIVIL,ZESTRIL) 40 MG tablet Take 40 mg by mouth daily.    ? methocarbamol (ROBAXIN) 500 MG tablet Take 500 mg by mouth at bedtime.    ? nitroGLYCERIN (NITROSTAT) 0.4 MG SL tablet Place 0.4 mg under the tongue every 5 (five) minutes as needed for chest pain.     ? Omega-3 Fatty Acids (FISH OIL) 1000 MG CAPS Take 2,000 mg by mouth in the morning, at noon, and at bedtime.    ? pravastatin (PRAVACHOL) 40 MG tablet Take 40 mg by mouth daily.    ? propranolol (INDERAL) 40 MG tablet Take 40 mg by mouth 2 (two) times daily.    ? sodium chloride (OCEAN) 0.65 % SOLN nasal spray Place 2 sprays into both nostrils as needed for congestion.    ? vitamin B-12 (CYANOCOBALAMIN) 500 MCG tablet Take 1,000 mcg by mouth daily.    ? colchicine 0.6 MG tablet Take 1 tablet (0.6 mg total) by mouth 2 (two) times daily for 5 days. 10 tablet 0  ? ?No current facility-administered medications for this encounter.  ? ? ?Allergies  ?Allergen Reactions  ? Codeine Other (See Comments)  ?  hallucinations  ? Metformin   ?  Other  reaction(s): Abdominal discomfort  ? Montelukast Diarrhea  ? Sulfonamide Derivatives Swelling  ?  Throat swelling  ? ? ?Social History  ? ?Socioeconomic History  ? Marital status: Married  ?  Spouse name: Not on file  ? Number of children: 2  ? Years of education: Not on file  ? Highest education level: Not on file  ?Occupational History  ? Occupation: Librarian, academic at [employer]  ?Tobacco Use  ? Smoking status: Never  ? Smokeless tobacco: Never  ? Tobacco comments:  ?  Never smoke 09/20/21  ?Vaping Use  ? Vaping  Use: Never used  ?Substance and Sexual Activity  ? Alcohol use: No  ? Drug use: No  ? Sexual activity: Yes  ?Other Topics Concern  ? Not on file  ?Social History Narrative  ? Lives in [place]  ? 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 ?Social Determinants of Health  ? ?Financial Resource Strain: Not on file  ?Food Insecurity: Not on file  ?Transportation Needs: Not on file  ?Physical Activity: Not on file  ?Stress: Not on file  ?Social Connections: Not on file  ?Intimate Partner Violence: Not on file  ? ? ? ?ROS- All systems are reviewed and negative except as per the HPI above. ? ?Physical Exam: ?Vitals:  ? 09/20/21 1106  ?BP: (!) 146/70  ?Pulse: (!) 53  ?Weight: 85.8 kg  ?Height: 5\' 8"  (1.727 m)  ? ? ?GEN- The patient is a well appearing elderly male, alert and oriented x 3 today.   ?Head- normocephalic, atraumatic ?Eyes-  Sclera clear, conjunctiva pink ?Ears- hearing intact ?Oropharynx- clear ?Neck- supple  ?Lungs- Clear to ausculation bilaterally, normal work of breathing ?Heart- Regular rate and rhythm, no murmurs, rubs or gallops  ?GI- soft, NT, ND, + BS ?Extremities- no clubbing, cyanosis, or edema ?MS- no significant deformity or atrophy ?Skin- no rash or lesion ?Psych- euthymic mood, full affect ?Neuro- strength and sensation are intact ? ?Wt Readings from Last 3 Encounters:  ?09/20/21 85.8 kg  ?08/23/21 85.3 kg  ?05/29/21 88.3 kg  ? ? ?EKG today demonstrates  ?SB ?Vent. rate 53 BPM ?PR  interval 186 ms ?QRS duration 98 ms ?QT/QTcB 436/409 ms ? ?Epic records are reviewed at length today ? ?CHA2DS2-VASc Score = 4  ?The patient's score is based upon: ?CHF History: 0 ?HTN History: 1 ?Diabetes History

## 2021-09-27 ENCOUNTER — Ambulatory Visit: Payer: Medicare Other | Admitting: Podiatry

## 2021-11-08 ENCOUNTER — Ambulatory Visit: Payer: Medicare Other | Admitting: Podiatry

## 2021-11-15 ENCOUNTER — Ambulatory Visit: Payer: Medicare Other | Admitting: Podiatry

## 2021-11-19 ENCOUNTER — Telehealth: Payer: Self-pay | Admitting: Physician Assistant

## 2021-11-19 NOTE — Telephone Encounter (Signed)
Patient appt rescheduled to 7/5 with Dr. Lalla Brothers for 3 mo post ablation.

## 2021-11-19 NOTE — Telephone Encounter (Signed)
Patient said that he spoke to a nurse who was supposed to change his appt back to 11/21/21 from 11/29/21 due to patient's authorization from [place] expiring.

## 2021-11-19 NOTE — Telephone Encounter (Signed)
Rinaldo Ratel, April, CMA  You 58 minutes ago (11:33 AM)    Shane Peterson is not back in the office until 7/18. Shane Peterson is in the hospital this week so the first available would be the 11th with her.     You  Rinaldo Ratel, April, CMA 3 hours ago (9:19 AM)    Can you look at this? Obviously, Shane Peterson is not in and her first available isn't until the 11th/12th. Can we maybe get him in with [facility]?    Reached out to patient to offer him the sooner appt (7/11) and he states that he has already spoke with someone at the [place] who states that they can do his EKG and follow-up there because his "authorization runs out on the 9th." Pt requesting to cancel 78m a-fib ablation at this time. Will cancel appt and route to lambert's RN as an FYI in case there is anything else the patient is not considering with the cancellation.

## 2021-11-20 NOTE — Progress Notes (Signed)
Electrophysiology Office Follow up Visit Note:    Date:  11/21/2021   ID:  Shane Peterson, DOB 08-26-1945, MRN [id number]  PCP:  [name], PA-C  [facility] Cardiologist:  None  [facility] Electrophysiologist:  [name], MD    Interval History:    Shane Peterson is a 76 y.o. male who presents for a follow up visit after his 08/23/2021 ablation.  During the ablation the veins were isolated. He saw Shane Peterson in the AF clinic 09/20/2021.At that appointment he reported no sustained episodes of recurrent AF. He takes eliquis for stroke ppx. He uses propranolol and eliquis at home.  He tells me he is doing very well since the ablation.  He has noticed a remarkable difference after the ablation procedure.  No sustained episodes of arrhythmia.  Doing okay on Eliquis.     Past Medical History:  Diagnosis Date   Atrial fibrillation [facility])    Coronary artery disease    s/p CABG   Family history of adverse reaction to anesthesia    "daughter get sick" (03/10/2017)   GERD (gastroesophageal reflux disease)    HLD (hyperlipidemia)    HTN (hypertension)    IHD (ischemic heart disease)    PONV (postoperative nausea and vomiting)    PVC's (premature ventricular contractions)    Type II diabetes mellitus (HCC)     Past Surgical History:  Procedure Laterality Date   ANTERIOR CERVICAL DECOMP/DISCECTOMY FUSION  05/2000   Shane Peterson 10/02/2010   APPENDECTOMY  1962   ATRIAL FIBRILLATION ABLATION N/A 08/23/2021   Procedure: ATRIAL FIBRILLATION ABLATION;  Surgeon: [name], MD;  Location: [facility];  Service: Cardiovascular;  Laterality: N/A;   BACK SURGERY     CARDIAC CATHETERIZATION  07/2000; 07/2001, 08/2006   Shane Peterson 10/02/2010   CORONARY ARTERY BYPASS GRAFT  07/2000   CABG X "5"   LAPAROSCOPIC CHOLECYSTECTOMY  01/2009   Shane Peterson 02/24/2009   POSTERIOR CERVICAL FUSION/FORAMINOTOMY  2001   foraminectomy/notes 10/02/2010   TONSILLECTOMY      Current Medications: Current  Meds  Medication Sig   acetaminophen (TYLENOL) 500 MG tablet Take 1,000 mg by mouth every 8 (eight) hours as needed for moderate pain.   Alcohol Swabs (ALCOHOL PREP) PADS by Does not apply route.   Alogliptin Benzoate 25 MG TABS Take 25 mg by mouth daily.   ALPRAZolam (XANAX) 0.5 MG tablet Take 0.5 mg by mouth 2 (two) times daily as needed for anxiety.   amLODipine (NORVASC) 10 MG tablet Take 10 mg by mouth daily.   apixaban (ELIQUIS) 5 MG TABS tablet Take 1 tablet (5 mg total) by mouth 2 (two) times daily.   carboxymethylcellulose (REFRESH PLUS) 0.5 % SOLN Place 1 drop into both eyes 3 (three) times daily as needed (dry eyes).   cetirizine (ZYRTEC) 10 MG tablet Take 10 mg by mouth daily.   Cholecalciferol (VITAMIN D) 2000 UNITS tablet Take 2,000 Units by mouth daily.   diclofenac Sodium (VOLTAREN) 1 % GEL Apply 2 g topically daily as needed (pain).   esomeprazole (NEXIUM) 40 MG capsule Take 40 mg by mouth 2 (two) times daily before a meal.   fluticasone (FLONASE) 50 MCG/ACT nasal spray Place 2 sprays into both nostrils daily as needed for allergies or rhinitis.   gabapentin (NEURONTIN) 400 MG capsule Take 400 mg by mouth 2 (two) times daily.   glipiZIDE (GLUCOTROL) 10 MG tablet Take 10 mg by mouth 2 (two) times daily before a meal.  glucose blood test strip 1 each by Other route as needed for Other. Precision xtra   guaifenesin (HUMIBID E) 400 MG TABS tablet Take 400 mg by mouth in the morning and at bedtime.   insulin glargine (LANTUS SOLOSTAR) 100 UNIT/ML Solostar Pen Inject 26 Units into the skin every morning.   ketotifen (ZADITOR) 0.025 % ophthalmic solution Place 1 drop into both eyes 2 (two) times daily as needed (eye allergies).   lisinopril (PRINIVIL,ZESTRIL) 40 MG tablet Take 40 mg by mouth daily.   methocarbamol (ROBAXIN) 500 MG tablet Take 500 mg by mouth at bedtime.   nitroGLYCERIN (NITROSTAT) 0.4 MG SL tablet Place 0.4 mg under the tongue every 5 (five) minutes as needed for  chest pain.    Omega-3 Fatty Acids (FISH OIL) 1000 MG CAPS Take 2,000 mg by mouth in the morning, at noon, and at bedtime.   pravastatin (PRAVACHOL) 40 MG tablet Take 40 mg by mouth daily.   propranolol (INDERAL) 40 MG tablet Take 40 mg by mouth 2 (two) times daily.   sodium chloride (OCEAN) 0.65 % SOLN nasal spray Place 2 sprays into both nostrils as needed for congestion.   vitamin B-12 (CYANOCOBALAMIN) 500 MCG tablet Take 1,000 mcg by mouth daily.     Allergies:   Codeine, Metformin, Montelukast, and Sulfonamide derivatives   Social History   Socioeconomic History   Marital status: Married    Spouse name: Not on file   Number of children: 2   Years of education: Not on file   Highest education level: Not on file  Occupational History   Occupation: Merchandiser, retail at [employer]  Tobacco Use   Smoking status: Never   Smokeless tobacco: Never   Tobacco comments:    Never smoke 09/20/21  Vaping Use   Vaping Use: Never used  Substance and Sexual Activity   Alcohol use: No   Drug use: No   Sexual activity: Yes  Other Topics Concern   Not on file  Social History Narrative   Lives in [place]   Retired   Now sells concessions   Social Determinants of Health   Financial Resource Strain: Not on file  Food Insecurity: Not on file  Transportation Needs: Not on file  Physical Activity: Not on file  Stress: Not on file  Social Connections: Not on file     Family History: The patient's family history includes Atrial fibrillation in an other family member; Cerebral palsy in an other family member; Heart attack in an other family member; Hypertension in an other family member.  ROS:   Please see the history of present illness.    All other systems reviewed and are negative.  EKGs/Labs/Other Studies Reviewed:    The following studies were reviewed today:   EKG:  The ekg ordered today demonstrates sinus rhythm.  Recent Labs: 08/02/2021: BUN 17; Creatinine, Ser  0.89; Hemoglobin 14.6; Platelets 161; Potassium 4.4; Sodium 139  Recent Lipid Panel    Component Value Date/Time   CHOL  08/05/2010 1008    159        ATP III CLASSIFICATION:  <200     mg/dL   Desirable  540-981  mg/dL   Borderline High  >=191    mg/dL   High          TRIG 84 08/05/2010 1008   HDL 30 (L) 08/05/2010 1008   CHOLHDL 5.3 08/05/2010 1008   VLDL 17 08/05/2010 1008   LDLCALC (H) 08/05/2010 1008    112  Total Cholesterol/HDL:CHD Risk Coronary Heart Disease Risk Table                     Men   Women  1/2 Average Risk   3.4   3.3  Average Risk       5.0   4.4  2 X Average Risk   9.6   7.1  3 X Average Risk  23.4   11.0        Use the calculated Patient Ratio above and the CHD Risk Table to determine the patient's CHD Risk.        ATP III CLASSIFICATION (LDL):  <100     mg/dL   Optimal  829-562  mg/dL   Near or Above                    Optimal  130-159  mg/dL   Borderline  130-865  mg/dL   High  >784     mg/dL   Very High    Physical Exam:    VS:  BP 114/72   Pulse 61   Ht 5\' 8"  (1.727 m)   Wt 188 lb (85.3 kg)   SpO2 95%   BMI 28.59 kg/m     Wt Readings from Last 3 Encounters:  11/21/21 188 lb (85.3 kg)  09/20/21 189 lb 3.2 oz (85.8 kg)  08/23/21 188 lb (85.3 kg)     GEN:  Well nourished, well developed in no acute distress HEENT: Normal NECK: No JVD; No carotid bruits LYMPHATICS: No lymphadenopathy CARDIAC: RRR, no murmurs, rubs, gallops RESPIRATORY:  Clear to auscultation without rales, wheezing or rhonchi  ABDOMEN: Soft, non-tender, non-distended MUSCULOSKELETAL:  No edema; No deformity  SKIN: Warm and dry NEUROLOGIC:  Alert and oriented x 3 PSYCHIATRIC:  Normal affect        ASSESSMENT:    1. Paroxysmal atrial fibrillation (HCC)   2. PVCs (premature ventricular contractions)    PLAN:    In order of problems listed above:  #Paroxysmal atrial fibrillation Maintaining sinus rhythm after his ablation.  Continue Eliquis and  propranolol.  Follow-up in 1 year or sooner as needed.   Medication Adjustments/Labs and Tests Ordered: Current medicines are reviewed at length with the patient today.  Concerns regarding medicines are outlined above.  No orders of the defined types were placed in this encounter.  No orders of the defined types were placed in this encounter.    Signed, Steffanie Dunn, MD, [facility], [facility] 11/21/2021 2:01 PM    Electrophysiology [facility]

## 2021-11-21 ENCOUNTER — Encounter: Payer: Self-pay | Admitting: Cardiology

## 2021-11-21 ENCOUNTER — Ambulatory Visit: Payer: No Typology Code available for payment source | Admitting: Cardiology

## 2021-11-21 ENCOUNTER — Ambulatory Visit (INDEPENDENT_AMBULATORY_CARE_PROVIDER_SITE_OTHER): Payer: No Typology Code available for payment source | Admitting: Cardiology

## 2021-11-21 VITALS — BP 114/72 | HR 61 | Ht 68.0 in | Wt 188.0 lb

## 2021-11-21 DIAGNOSIS — I480 Paroxysmal atrial fibrillation: Secondary | ICD-10-CM | POA: Diagnosis not present

## 2021-11-21 DIAGNOSIS — I493 Ventricular premature depolarization: Secondary | ICD-10-CM

## 2021-11-21 NOTE — Patient Instructions (Signed)
Medication Instructions:  Your physician recommends that you continue on your current medications as directed. Please refer to the Current Medication list given to you today. *If you need a refill on your cardiac medications before your next appointment, please call your pharmacy*  Lab Work: None. If you have labs (blood work) drawn today and your tests are completely normal, you will receive your results only by: MyChart Message (if you have MyChart) OR A paper copy in the mail If you have any lab test that is abnormal or we need to change your treatment, we will call you to review the results.  Testing/Procedures: None.  Follow-Up: At [facility], you and your health needs are our priority.  As part of our continuing mission to provide you with exceptional heart care, we have created designated Provider Care Teams.  These Care Teams include your primary Cardiologist (physician) and Advanced Practice Providers (APPs -  Physician Assistants and Nurse Practitioners) who all work together to provide you with the care you need, when you need it.  Your physician wants you to follow-up in: 12 months with Cameron [place], MD     You will receive a reminder letter in the mail two months in advance. If you don't receive a letter, please call our office to schedule the follow-up appointment.  We recommend signing up for the patient portal called "MyChart".  Sign up information is provided on this After Visit Summary.  MyChart is used to connect with patients for Virtual Visits (Telemedicine).  Patients are able to view lab/test results, encounter notes, upcoming appointments, etc.  Non-urgent messages can be sent to your provider as well.   To learn more about what you can do with MyChart, go to [email or web address].com.    Any Other Special Instructions Will Be Listed Below (If Applicable).         

## 2021-11-22 ENCOUNTER — Ambulatory Visit (INDEPENDENT_AMBULATORY_CARE_PROVIDER_SITE_OTHER): Payer: No Typology Code available for payment source

## 2021-11-22 ENCOUNTER — Ambulatory Visit: Payer: Medicare Other | Admitting: Podiatry

## 2021-11-22 DIAGNOSIS — E1149 Type 2 diabetes mellitus with other diabetic neurological complication: Secondary | ICD-10-CM

## 2021-11-22 DIAGNOSIS — E1140 Type 2 diabetes mellitus with diabetic neuropathy, unspecified: Secondary | ICD-10-CM | POA: Diagnosis not present

## 2021-11-22 DIAGNOSIS — M21621 Bunionette of right foot: Secondary | ICD-10-CM | POA: Diagnosis not present

## 2021-11-22 NOTE — Progress Notes (Signed)
Subjective:   Patient ID: Shane Peterson, male   DOB: 76 y.o.   MRN: [id number]   HPI Patient presents with long-term diabetes not in good control with prominence around the fifth metatarsal head right that is local to this area with no breakdown of skin currently but does get red and sore at times.  Patient complains of significant neurological symptoms and does take gabapentin     ROS      Objective:  Physical Exam  Vascular status diminished but intact bilateral with patient found to have prominence around the fifth metatarsal head right and diminishment of neurological sharp dull vibratory bilateral.  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  Patient is casted for a diabetic shoe to wear

## 2021-11-29 ENCOUNTER — Ambulatory Visit: Payer: No Typology Code available for payment source | Admitting: Physician Assistant

## 2022-02-13 NOTE — Progress Notes (Signed)
Left without being seen.

## 2022-02-22 ENCOUNTER — Telehealth: Payer: Self-pay | Admitting: Podiatry

## 2022-02-22 NOTE — Telephone Encounter (Signed)
Returned call to pt. Pt stated that he was to have diabetic shoes and inserts ordered. I explained to the patient that there is no documentation that he was seen, it in fact states that he left without seeing the casting dept. I advised pt that I would submit the paperwork over to his PCP office.

## 2022-04-04 ENCOUNTER — Other Ambulatory Visit (HOSPITAL_COMMUNITY): Payer: Self-pay

## 2023-05-23 ENCOUNTER — Ambulatory Visit: Payer: No Typology Code available for payment source | Admitting: Cardiology

## 2023-07-02 NOTE — Progress Notes (Unsigned)
  Electrophysiology Office Follow up Visit Note:    Date:  07/03/2023   ID:  Shane Peterson, DOB 07-05-1945, MRN [id number]  PCP:  [name], PA-C  [facility] Cardiologist:  None  [facility] Electrophysiologist:  [name], MD    Interval History:     Shane Peterson is a 78 y.o. male who presents for a follow up visit.   I last saw the patient November 21, 2021.  He had a prior A-fib ablation August 23, 2021.  He was on Eliquis for stroke prophylaxis.   He is doing well today.  No recurrence of his arrhythmia.  He reports fatigue while on twice daily propranolol.  He is interested in reducing that if at all possible.  He continues to take Eliquis without bleeding issues.  He does report a strong heartbeat that is not frequent.  It is almost like a "skipped beat".  Likely consistent with prior PVCs.  No syncope or presyncope.  No lightheadedness.      Past medical, surgical, social and family history were reviewed.  ROS:   Please see the history of present illness.    All other systems reviewed and are negative.  EKGs/Labs/Other Studies Reviewed:    The following studies were reviewed today:     EKG Interpretation Date/Time:  Thursday July 03 2023 09:55:26 EST Ventricular Rate:  52 PR Interval:  190 QRS Duration:  96 QT Interval:  434 QTC Calculation: 403 R Axis:   65  Text Interpretation: Sinus bradycardia Confirmed by Steffanie Dunn [phone]) on 07/03/2023 10:15:41 AM    Physical Exam:    VS:  BP 122/66   Pulse (!) 52   Ht 5\' 8"  (1.727 m)   Wt 166 lb 6.4 oz (75.5 kg)   SpO2 97%   BMI 25.30 kg/m     Wt Readings from Last 3 Encounters:  07/03/23 166 lb 6.4 oz (75.5 kg)  11/21/21 188 lb (85.3 kg)  09/20/21 189 lb 3.2 oz (85.8 kg)     GEN: no distress CARD: RRR, No MRG RESP: No IWOB. CTAB.      ASSESSMENT:    1. Paroxysmal atrial fibrillation (HCC)    PLAN:    In order of problems listed above:  #Paroxysmal atrial  fibrillation Prior catheter ablation in April 2023.  Takes Eliquis for stroke prophylaxis. Decrease propranolol to 20 mg by mouth twice daily to help with fatigue  #PVCs Stable symptoms.  If symptoms were to get worse have advised him to let us know and we would order a ZIO monitor.  Continue propranolol.  Decrease dose as above.  Signed, Steffanie Dunn, MD, [facility], [facility] 07/03/2023 10:22 AM    Electrophysiology [facility]

## 2023-07-03 ENCOUNTER — Encounter: Payer: Self-pay | Admitting: Cardiology

## 2023-07-03 ENCOUNTER — Ambulatory Visit: Payer: No Typology Code available for payment source | Attending: Cardiology | Admitting: Cardiology

## 2023-07-03 VITALS — BP 122/66 | HR 52 | Ht 68.0 in | Wt 166.4 lb

## 2023-07-03 DIAGNOSIS — I480 Paroxysmal atrial fibrillation: Secondary | ICD-10-CM | POA: Diagnosis not present

## 2023-07-03 DIAGNOSIS — I493 Ventricular premature depolarization: Secondary | ICD-10-CM | POA: Diagnosis not present

## 2023-07-03 MED ORDER — PROPRANOLOL HCL 20 MG PO TABS
20.0000 mg | ORAL_TABLET | Freq: Two times a day (BID) | ORAL | 3 refills | Status: AC
Start: 2023-07-03 — End: ?

## 2023-07-03 NOTE — Patient Instructions (Signed)
Medication Instructions:  Your physician has recommended you make the following change in your medication: 1) DECREASE propanolol to 20 mg twice daily  *If you need a refill on your cardiac medications before your next appointment, please call your pharmacy*  Follow-Up: At [facility], you and your health needs are our priority.  As part of our continuing mission to provide you with exceptional heart care, we have created designated Provider Care Teams.  These Care Teams include your primary Cardiologist (physician) and Advanced Practice Providers (APPs -  Physician Assistants and Nurse Practitioners) who all work together to provide you with the care you need, when you need it.   Your next appointment:   1 year  Provider:   You will see one of the following Advanced Practice Providers on your designated Care Team:   Francis Dowse, Charlott Holler [address]" [place], [place] Sherie Don, NP Canary Brim, NP

## 2024-05-06 ENCOUNTER — Encounter: Payer: Self-pay | Admitting: Cardiology

## 2024-08-13 ENCOUNTER — Ambulatory Visit: Admitting: Pulmonary Disease

## 2024-10-28 ENCOUNTER — Ambulatory Visit: Admitting: Cardiology
